# Patient Record
Sex: Male | Born: 1945 | Race: White | Hispanic: No | Marital: Married | Smoking: Former smoker
Health system: Southern US, Community
[De-identification: ages and names within clinical notes are randomized; demographics above are authoritative.]

## PROBLEM LIST (undated history)

## (undated) DIAGNOSIS — M419 Scoliosis, unspecified: Secondary | ICD-10-CM

## (undated) DIAGNOSIS — H5440 Blindness, one eye, unspecified eye: Secondary | ICD-10-CM

## (undated) HISTORY — PX: EYE SURGERY: SHX253

---

## 2005-03-17 ENCOUNTER — Ambulatory Visit: Payer: Self-pay | Admitting: Family Medicine

## 2005-05-22 ENCOUNTER — Ambulatory Visit: Payer: Self-pay | Admitting: Family Medicine

## 2005-06-05 ENCOUNTER — Ambulatory Visit (HOSPITAL_COMMUNITY): Admission: RE | Admit: 2005-06-05 | Discharge: 2005-06-05 | Payer: Self-pay | Admitting: *Deleted

## 2005-12-06 ENCOUNTER — Emergency Department (HOSPITAL_COMMUNITY): Admission: EM | Admit: 2005-12-06 | Discharge: 2005-12-06 | Payer: Self-pay | Admitting: Emergency Medicine

## 2016-06-05 ENCOUNTER — Encounter (HOSPITAL_COMMUNITY): Payer: Self-pay | Admitting: Emergency Medicine

## 2016-06-05 ENCOUNTER — Emergency Department (HOSPITAL_COMMUNITY)
Admission: EM | Admit: 2016-06-05 | Discharge: 2016-06-05 | Disposition: A | Discharge status: Home / Self Care | Payer: Medicare Other | Attending: Emergency Medicine | Admitting: Emergency Medicine

## 2016-06-05 DIAGNOSIS — Z87891 Personal history of nicotine dependence: Secondary | ICD-10-CM | POA: Insufficient documentation

## 2016-06-05 DIAGNOSIS — M5442 Lumbago with sciatica, left side: Secondary | ICD-10-CM | POA: Diagnosis not present

## 2016-06-05 DIAGNOSIS — M545 Low back pain: Secondary | ICD-10-CM | POA: Diagnosis present

## 2016-06-05 DIAGNOSIS — M5432 Sciatica, left side: Secondary | ICD-10-CM

## 2016-06-05 HISTORY — DX: Blindness, one eye, unspecified eye: H54.40

## 2016-06-05 HISTORY — DX: Scoliosis, unspecified: M41.9

## 2016-06-05 MED ORDER — PREDNISONE 20 MG PO TABS: 40.0000 mg | ORAL_TABLET | Freq: Every day | ORAL | 0 refills | Status: DC

## 2016-06-05 NOTE — ED Provider Notes (Signed)
[facility]EM[facility]Y DEPT Provider Note   CSN: [id number] Arrival date & time: 06/05/16  1145     History   Chief Complaint Chief Complaint  Patient presents with  . Hip Pain    HPI Frank Haas is a 71 y.o. male.  Patient presents to the emergency department with chief complaint of left-sided low back pain. He states that he was recently diagnosed with a herniated disc in his low back. He has been referred to a spine specialist, but has been unable to follow-up at this point. He states that the pain radiates down the front side of his left leg. He denies any bowel or bladder incontinence. He walks at baseline using a walker. He has tried taking OTC medications with no relief. There are no other associated symptoms or modifying factors.   The history is provided by the patient. No language interpreter was used.    Past Medical History:  Diagnosis Date  . Blind right eye   . Scoliosis     There are no active problems to display for this patient.   Past Surgical History:  Procedure Laterality Date  . EYE SURGERY     right eye removed       Home Medications    Prior to Admission medications   Medication Sig Start Date End Date Taking? Authorizing Provider  predniSONE (DELTASONE) 20 MG tablet Take 2 tablets (40 mg total) by mouth daily. Take 40 mg by mouth daily for 3 days, then 20mg  by mouth daily for 3 days, then 10mg  daily for 3 days 06/05/16   Roxy Horsemanobert Thamar Holik, PA-C    Family History No family history on file.  Social History Social History  Substance Use Topics  . Smoking status: Former Games developermoker  . Smokeless tobacco: Never Used  . Alcohol use No     Allergies   Demerol [meperidine]   Review of Systems Review of Systems  Constitutional: Negative for chills and fever.  Gastrointestinal:       No bowel incontinence  Genitourinary:       No urinary incontinence  Musculoskeletal: Positive for arthralgias, back pain and myalgias.  Neurological:       No  saddle anesthesia     Physical Exam Updated Vital Signs BP 139/89 (BP Location: Right Arm) Comment: Repeat. Patient has tremors in bilateral arms.  Pulse 107   Temp 98.3 F (36.8 C) (Oral)   Resp 20   Ht 6' (1.829 m)   Wt 90.7 kg   SpO2 97%   BMI 27.12 kg/m   Physical Exam Physical Exam  Constitutional: Pt appears well-developed and well-nourished. No distress.  HENT:  Head: Normocephalic and atraumatic.  Mouth/Throat: Oropharynx is clear and moist. No oropharyngeal exudate.  Eyes: Conjunctivae are normal.  Neck: Normal range of motion. Neck supple.  No meningismus Cardiovascular: Normal rate, regular rhythm and intact distal pulses.   Pulmonary/Chest: Effort normal and breath sounds normal. No respiratory distress. Pt has no wheezes.  Abdominal: Pt exhibits no distension Musculoskeletal:  Left lumbar musculature tender to palpation, no bony CTLS spine tenderness, deformity, step-off, or crepitus Lymphadenopathy: Pt has no cervical adenopathy.  Neurological: Pt is alert and oriented Speech is clear and goal oriented, follows commands Normal 5/5 strength in upper and lower extremities bilaterally including dorsiflexion and plantar flexion, strong and equal grip strength Sensation intact Great toe extension intact Moves extremities without ataxia, coordination intact Ambulates with walker No Clonus Skin: Skin is warm and dry. No rash noted. Pt is  not diaphoretic. No erythema.  Psychiatric: Pt has a normal mood and affect. Behavior is normal.  Nursing note and vitals reviewed.   ED Treatments / Results  Labs (all labs ordered are listed, but only abnormal results are displayed) Labs Reviewed - No data to display  EKG  EKG Interpretation None       Radiology No results found.  Procedures Procedures (including critical care time)  Medications Ordered in ED Medications - No data to display   Initial Impression / Assessment and Plan / ED Course  I have  reviewed the triage vital signs and the nursing notes.  Pertinent labs & imaging results that were available during my care of the patient were reviewed by me and considered in my medical decision making (see chart for details).  Clinical Course     Patient with back pain.  No neurological deficits and normal neuro exam.  Patient is ambulatory.  No loss of bowel or bladder control.  Doubt cauda equina.  Denies fever,  doubt epidural abscess or other lesion. Recommend back exercises, stretching, RICE, and will treat with a short course of prednisone.  Encouraged the patient that there could be a need for additional workup and/or imaging such as MRI, if the symptoms do not resolve. Patient advised that if the back pain does not resolve, or radiates, this could progress to more serious conditions and is encouraged to follow-up with PCP or orthopedics within 2 weeks.     Final Clinical Impressions(s) / ED Diagnoses   Final diagnoses:  Sciatica of left side    New Prescriptions New Prescriptions   PREDNISONE (DELTASONE) 20 MG TABLET    Take 2 tablets (40 mg total) by mouth daily. Take 40 mg by mouth daily for 3 days, then 20mg  by mouth daily for 3 days, then 10mg  daily for 3 days     Roxy Horseman, PA-C 06/05/16 1318    Jacalyn Lefevre, MD 06/05/16 1416

## 2016-06-05 NOTE — ED Triage Notes (Signed)
Pt slipped disk  L5 in November, today left hip and leg pain, burning

## 2016-06-05 NOTE — ED Notes (Signed)
Pt made aware to return if symptoms worsen or if any life threatening symptoms occur.   

## 2021-04-02 ENCOUNTER — Ambulatory Visit (INDEPENDENT_AMBULATORY_CARE_PROVIDER_SITE_OTHER): Payer: Medicare Other | Admitting: Family Medicine

## 2021-04-02 ENCOUNTER — Encounter: Payer: Self-pay | Admitting: Family Medicine

## 2021-04-02 ENCOUNTER — Other Ambulatory Visit: Payer: Self-pay

## 2021-04-02 VITALS — BP 142/89 | HR 97 | Temp 98.1°F | Ht 72.0 in | Wt 192.0 lb

## 2021-04-02 DIAGNOSIS — Z23 Encounter for immunization: Secondary | ICD-10-CM

## 2021-04-02 DIAGNOSIS — G250 Essential tremor: Secondary | ICD-10-CM

## 2021-04-02 DIAGNOSIS — M2550 Pain in unspecified joint: Secondary | ICD-10-CM

## 2021-04-02 DIAGNOSIS — H5440 Blindness, one eye, unspecified eye: Secondary | ICD-10-CM | POA: Insufficient documentation

## 2021-04-02 DIAGNOSIS — R030 Elevated blood-pressure reading, without diagnosis of hypertension: Secondary | ICD-10-CM | POA: Insufficient documentation

## 2021-04-02 DIAGNOSIS — Z5320 Procedure and treatment not carried out because of patient's decision for unspecified reasons: Secondary | ICD-10-CM

## 2021-04-02 DIAGNOSIS — R351 Nocturia: Secondary | ICD-10-CM

## 2021-04-02 DIAGNOSIS — M419 Scoliosis, unspecified: Secondary | ICD-10-CM | POA: Insufficient documentation

## 2021-04-02 DIAGNOSIS — Z6826 Body mass index (BMI) 26.0-26.9, adult: Secondary | ICD-10-CM | POA: Diagnosis not present

## 2021-04-02 MED ORDER — PROPRANOLOL HCL 10 MG PO TABS: 10.0000 mg | ORAL_TABLET | Freq: Two times a day (BID) | ORAL | 5 refills | Status: DC

## 2021-04-02 NOTE — Progress Notes (Signed)
Subjective:  Patient ID: Frank Haas, male    DOB: [birth year], 75 y.o.   MRN: [id number]  Patient Care Team: Baruch Gouty, FNP as PCP - General (Family Medicine)   Chief Complaint:  New Patient (Initial Visit)   HPI: Frank Haas is a 75 y.o. male presenting on 04/02/2021 for New Patient (Initial Visit)   Pt presents today to establish care with new PCP. Pt states he has not been seen by a PCP in over 3 years. EHR database is incomplete and records have been requested. His biggest concern today is his arthritis pain and tremors in both hands. He states he has had pain in his back, knees, and hips for several years. States the pain comes and goes, worse in the morning at times and with activity at times. He denies known injuries or recent falls. He has taken tylenol with some relief of symptoms. He has been seen in the ED in the past with similar complaints. Has not followed up with PCP or ortho pertaining to arthritic pain.  He states the tremors in his hands has been present for several years and seems to be worsening. Gets better with intentional movements. No loss of function.  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      Relevant past medical, surgical, family, and social history reviewed and updated as indicated.  Allergies and medications reviewed and updated. Data reviewed: Chart in Epic.   Past Medical History:  Diagnosis Date   Blind right eye    Scoliosis     Past Surgical History:  Procedure Laterality Date   EYE SURGERY     right eye removed    Social History   Socioeconomic History   Marital status: Married    Spouse name: Not on file   Number of children: Not on file   Years of education: Not on file   Highest education level: Not on file  Occupational History   Not on file  Tobacco Use   Smoking status: Former   Smokeless tobacco: Never  Substance and Sexual Activity   Alcohol use: No   Drug use: No   Sexual  activity: Not on file  Other Topics Concern   Not on file  Social History Narrative   Not on file   Social Determinants of Health   Financial Resource Strain: Not on file  Food Insecurity: Not on file  Transportation Needs: Not on file  Physical Activity: Not on file  Stress: Not on file  Social Connections: Not on file  Intimate Partner Violence: Not on file    Outpatient Encounter Medications as of 04/02/2021  Medication Sig   acetaminophen (TYLENOL) 650 MG CR tablet Take 650 mg by mouth as needed for pain.   propranolol (INDERAL) 10 MG tablet Take 1 tablet (10 mg total) by mouth 2 (two) times daily.   [DISCONTINUED] predniSONE (DELTASONE) 20 MG tablet Take 2 tablets (40 mg total) by mouth daily. Take 40 mg by mouth daily for 3 days, then 43m by mouth daily for 3 days, then 145mdaily for 3 days   No facility-administered encounter medications on file as of 04/02/2021.    Allergies  Allergen Reactions   Demerol [Meperidine] Nausea Only    Review of Systems  Constitutional:  Negative for activity change, appetite change, chills, diaphoresis, fatigue, fever and unexpected weight change.  HENT:  Positive for hearing loss.   Eyes:  Positive for  visual disturbance.  Respiratory:  Negative for cough, chest tightness and shortness of breath.   Cardiovascular:  Negative for chest pain, palpitations and leg swelling.  Gastrointestinal:  Negative for abdominal pain, blood in stool, constipation, diarrhea, nausea and vomiting.  Endocrine: Negative.   Genitourinary:  Negative for decreased urine volume, difficulty urinating, dysuria, flank pain, frequency, genital sores, hematuria, penile discharge, penile pain, penile swelling, scrotal swelling, testicular pain and urgency.       Frequent nocturia  Musculoskeletal:  Positive for arthralgias, back pain and gait problem. Negative for joint swelling, myalgias, neck pain and neck stiffness.  Skin: Negative.   Allergic/Immunologic:  Negative.   Neurological:  Positive for tremors. Negative for dizziness, seizures, syncope, facial asymmetry, speech difficulty, weakness, light-headedness, numbness and headaches.  Hematological: Negative.   Psychiatric/Behavioral:  Negative for confusion, hallucinations, sleep disturbance and suicidal ideas.   All other systems reviewed and are negative.      Objective:  BP (!) 142/89   Pulse 97   Temp 98.1 F (36.7 C)   Ht 6' (1.829 m)   Wt 192 lb (87.1 kg)   SpO2 95%   BMI 26.04 kg/m    Wt Readings from Last 3 Encounters:  04/02/21 192 lb (87.1 kg)  06/05/16 200 lb (90.7 kg)    Physical Exam Vitals and nursing note reviewed.  Constitutional:      General: He is not in acute distress.    Appearance: Normal appearance. He is well-developed, well-groomed and overweight. He is not ill-appearing, toxic-appearing or diaphoretic.  HENT:     Head: Normocephalic and atraumatic.     Jaw: There is normal jaw occlusion.     Right Ear: Tympanic membrane and external ear normal. Decreased hearing noted.     Left Ear: Tympanic membrane and external ear normal. Decreased hearing noted.     Ears:     Comments: Bilateral cerumen noted, not impacted.     Nose: Nose normal.     Mouth/Throat:     Lips: Pink.     Mouth: Mucous membranes are moist.     Pharynx: Oropharynx is clear. Uvula midline.  Eyes:     General: Lids are normal.     Conjunctiva/sclera: Conjunctivae normal.     Comments: Prosthetic right eye. Left pupil reactive to light  Neck:     Thyroid: No thyroid mass, thyromegaly or thyroid tenderness.     Vascular: No carotid bruit or JVD.     Trachea: Trachea and phonation normal.  Cardiovascular:     Rate and Rhythm: Normal rate and regular rhythm.     Chest Wall: PMI is not displaced.     Pulses: Normal pulses.     Heart sounds: Normal heart sounds. No murmur heard.   No friction rub. No gallop.  Pulmonary:     Effort: Pulmonary effort is normal. No respiratory  distress.     Breath sounds: Normal breath sounds. No wheezing.  Abdominal:     General: Bowel sounds are normal. There is no distension or abdominal bruit.     Palpations: Abdomen is soft. There is no hepatomegaly or splenomegaly.     Tenderness: There is no abdominal tenderness. There is no right CVA tenderness or left CVA tenderness.     Hernia: No hernia is present.  Musculoskeletal:     Cervical back: Neck supple. No swelling, edema, deformity, erythema, signs of trauma, lacerations, rigidity, spasms, torticollis, tenderness, bony tenderness or crepitus. No pain with movement. Normal range of  motion.     Thoracic back: No swelling, edema, deformity, signs of trauma, lacerations, spasms, tenderness or bony tenderness. Decreased range of motion. Scoliosis present.     Lumbar back: Tenderness present. No swelling, edema, deformity, signs of trauma, lacerations, spasms or bony tenderness. Decreased range of motion. Negative right straight leg raise test and negative left straight leg raise test. Scoliosis present.     Right lower leg: No edema.     Left lower leg: No edema.  Lymphadenopathy:     Cervical: No cervical adenopathy.  Skin:    General: Skin is warm and dry.     Capillary Refill: Capillary refill takes less than 2 seconds.     Coloration: Skin is not cyanotic, jaundiced or pale.     Findings: No rash.  Neurological:     General: No focal deficit present.     Mental Status: He is alert and oriented to person, place, and time.     GCS: GCS eye subscore is 4. GCS verbal subscore is 5. GCS motor subscore is 6.     Cranial Nerves: No cranial nerve deficit.     Sensory: Sensation is intact.     Motor: Tremor (bilateral upper extremities) present. No weakness.     Coordination: Coordination is intact.     Gait: Gait abnormal.     Deep Tendon Reflexes: Reflexes are normal and symmetric.  Psychiatric:        Attention and Perception: Attention and perception normal.        Mood and  Affect: Mood and affect normal.        Speech: Speech normal.        Behavior: Behavior normal. Behavior is cooperative.        Thought Content: Thought content normal.        Cognition and Memory: Cognition and memory normal.        Judgment: Judgment normal.    No results found for this or any previous visit.     Pertinent labs & imaging results that were available during my care of the patient were reviewed by me and considered in my medical decision making.  Assessment & Plan:  Elton was seen today for new patient (initial visit).  Diagnoses and all orders for this visit:  Essential tremor Will check thyroid function. Will start on low dose propanolol to see if beneficial. If symptoms progress, will refer to neurology. Pt declines referral today.  -     Thyroid Panel With TSH -     propranolol (INDERAL) 10 MG tablet; Take 1 tablet (10 mg total) by mouth 2 (two) times daily.  BMI 26.0-26.9,adult Labs pending. Diet and exercise encouraged.  -     CBC with Differential/Platelet -     CMP14+EGFR -     Lipid panel -     Thyroid Panel With TSH  Elevated blood-pressure reading without diagnosis of hypertension DASH diet and exercise encouraged. BB therapy for tremors will be beneficial. Labs pending. Report any persistent elevated readings.  -     CBC with Differential/Platelet -     CMP14+EGFR -     Lipid panel -     Thyroid Panel With TSH  Nocturia more than twice per night Declines DRE. Will check PSA today. No indications of acute prostatitis or chronic prostatitis, likley BPH with LUTS. Will initiate therapy if symptoms worsen.  -     PSA, total and free  Multiple joint pain Symptomatic care discussed in  detail. Will obtain arthritis panel. Further treatment pending results.  -     Arthritis Panel  Screening for malignant neoplasm of colon declined Offered referral for colonoscopy and order for cologuard, pt declined both.   Need for influenza vaccination Influenza  vaccination given today.    Continue all other maintenance medications.  Follow up plan: Return in about 6 weeks (around 05/14/2021), or if symptoms worsen or fail to improve, for tremor.   Continue healthy lifestyle choices, including diet (rich in fruits, vegetables, and lean proteins, and low in salt and simple carbohydrates) and exercise (at least 30 minutes of moderate physical activity daily).  Educational handout given for arthritis, tremor  The above assessment and management plan was discussed with the patient. The patient verbalized understanding of and has agreed to the management plan. Patient is aware to call the clinic if they develop any new symptoms or if symptoms persist or worsen. Patient is aware when to return to the clinic for a follow-up visit. Patient educated on when it is appropriate to go to the emergency department.   Monia Pouch, FNP-C [facility] [phone]

## 2021-04-03 ENCOUNTER — Other Ambulatory Visit: Payer: Self-pay | Admitting: *Deleted

## 2021-04-03 DIAGNOSIS — R030 Elevated blood-pressure reading, without diagnosis of hypertension: Secondary | ICD-10-CM

## 2021-04-03 LAB — ARTHRITIS PANEL
Basophils Absolute: 1.2 10*3/uL — ABNORMAL HIGH (ref 0.0–0.2)
Basos: 8 %
EOS (ABSOLUTE): 3.4 10*3/uL — ABNORMAL HIGH (ref 0.0–0.4)
Eos: 22 %
Hematocrit: 40.6 % (ref 37.5–51.0)
Hemoglobin: 12.7 g/dL — ABNORMAL LOW (ref 13.0–17.7)
Immature Grans (Abs): 0.2 10*3/uL — ABNORMAL HIGH (ref 0.0–0.1)
Immature Granulocytes: 1 %
Lymphocytes Absolute: 3.5 10*3/uL — ABNORMAL HIGH (ref 0.7–3.1)
Lymphs: 23 %
MCH: 25.8 pg — ABNORMAL LOW (ref 26.6–33.0)
MCHC: 31.3 g/dL — ABNORMAL LOW (ref 31.5–35.7)
MCV: 83 fL (ref 79–97)
Monocytes Absolute: 0.4 10*3/uL (ref 0.1–0.9)
Monocytes: 2 %
NRBC: 2 % — ABNORMAL HIGH (ref 0–0)
Neutrophils Absolute: 6.7 10*3/uL (ref 1.4–7.0)
Neutrophils: 44 %
Platelets: 452 10*3/uL — ABNORMAL HIGH (ref 150–450)
RBC: 4.92 x10E6/uL (ref 4.14–5.80)
RDW: 32.9 % — ABNORMAL HIGH (ref 11.6–15.4)
Rhuematoid fact SerPl-aCnc: <10 IU/mL (ref <14.0)
Sed Rate: 10 mm/hr (ref 0–30)
Uric Acid: 7.3 mg/dL (ref 3.8–8.4)
WBC: 15.3 10*3/uL — ABNORMAL HIGH (ref 3.4–10.8)

## 2021-04-03 LAB — THYROID PANEL WITH TSH
Free Thyroxine Index: 1.8 (ref 1.2–4.9)
T3 Uptake Ratio: 26 % (ref 24–39)
T4, Total: 7 ug/dL (ref 4.5–12.0)
TSH: 1.11 u[IU]/mL (ref 0.450–4.500)

## 2021-04-03 LAB — CMP14+EGFR
ALT: 10 IU/L (ref 0–44)
AST: 17 IU/L (ref 0–40)
Albumin/Globulin Ratio: 2.1 (ref 1.2–2.2)
Albumin: 4.5 g/dL (ref 3.7–4.7)
Alkaline Phosphatase: 60 IU/L (ref 44–121)
BUN/Creatinine Ratio: 8 — ABNORMAL LOW (ref 10–24)
BUN: 9 mg/dL (ref 8–27)
Bilirubin Total: 1.3 mg/dL — ABNORMAL HIGH (ref 0.0–1.2)
CO2: 24 mmol/L (ref 20–29)
Calcium: 9.3 mg/dL (ref 8.6–10.2)
Chloride: 104 mmol/L (ref 96–106)
Creatinine, Ser: 1.07 mg/dL (ref 0.76–1.27)
Globulin, Total: 2.1 g/dL (ref 1.5–4.5)
Glucose: 98 mg/dL (ref 70–99)
Potassium: 5.4 mmol/L — ABNORMAL HIGH (ref 3.5–5.2)
Sodium: 140 mmol/L (ref 134–144)
Total Protein: 6.6 g/dL (ref 6.0–8.5)
eGFR: 73 mL/min/{1.73_m2} (ref >59)

## 2021-04-03 LAB — PSA, TOTAL AND FREE
PSA, Free Pct: 36.7 %
PSA, Free: 0.33 ng/mL
Prostate Specific Ag, Serum: 0.9 ng/mL (ref 0.0–4.0)

## 2021-04-03 LAB — LIPID PANEL
Chol/HDL Ratio: 3.5 ratio (ref 0.0–5.0)
Cholesterol, Total: 137 mg/dL (ref 100–199)
HDL: 39 mg/dL — ABNORMAL LOW (ref >39)
LDL Chol Calc (NIH): 83 mg/dL (ref 0–99)
Triglycerides: 76 mg/dL (ref 0–149)
VLDL Cholesterol Cal: 15 mg/dL (ref 5–40)

## 2021-04-05 ENCOUNTER — Other Ambulatory Visit: Payer: Medicare Other

## 2021-05-07 ENCOUNTER — Telehealth: Payer: Self-pay | Admitting: Family Medicine

## 2021-05-14 ENCOUNTER — Ambulatory Visit (INDEPENDENT_AMBULATORY_CARE_PROVIDER_SITE_OTHER): Payer: Medicare Other

## 2021-05-14 ENCOUNTER — Encounter: Payer: Self-pay | Admitting: Family Medicine

## 2021-05-14 ENCOUNTER — Ambulatory Visit (INDEPENDENT_AMBULATORY_CARE_PROVIDER_SITE_OTHER): Payer: Medicare Other | Admitting: Family Medicine

## 2021-05-14 VITALS — BP 132/74 | HR 71 | Temp 98.0°F | Ht 72.0 in | Wt 191.0 lb

## 2021-05-14 DIAGNOSIS — G250 Essential tremor: Secondary | ICD-10-CM | POA: Diagnosis not present

## 2021-05-14 DIAGNOSIS — E875 Hyperkalemia: Secondary | ICD-10-CM

## 2021-05-14 DIAGNOSIS — M545 Low back pain, unspecified: Secondary | ICD-10-CM

## 2021-05-14 DIAGNOSIS — H6123 Impacted cerumen, bilateral: Secondary | ICD-10-CM

## 2021-05-14 DIAGNOSIS — R7989 Other specified abnormal findings of blood chemistry: Secondary | ICD-10-CM | POA: Diagnosis not present

## 2021-05-14 NOTE — Patient Instructions (Signed)
Debrox

## 2021-05-14 NOTE — Progress Notes (Signed)
Subjective:  Patient ID: JES COSTALES, male    DOB: 1946-03-06, 74 y.o.   MRN: [id number]  Patient Care Team: Baruch Gouty, FNP as PCP - General (Family Medicine)   Chief Complaint:  Tremors (6 week follow up)   HPI: ZEB RAWL is a 75 y.o. male presenting on 05/14/2021 for Tremors (6 week follow up)   Pt presents today for follow up after being started on Propanolol for resting upper extremity tremors. He has been taking the medications as prescribed without associated side effects. States the tremor has improved greatly with the medications, but is still present. He states his hearing continues to be decreased. No otalgia. He complains of continued lower back pain that radiates to his left leg at times. He takes tylenol or motrin with some relief of symptoms. No saddle anesthesia, loss of bowel or bladder function, or weakness. Labs from prior visit revealed elevated platelet count, potassium, and WBC. Pt denies any indications of systemic infection. No abnormal bleeding or bruising, chest pain, palpitations, or myalgias.     Relevant past medical, surgical, family, and social history reviewed and updated as indicated.  Allergies and medications reviewed and updated. Data reviewed: Chart in Epic.   Past Medical History:  Diagnosis Date   Blind right eye    Scoliosis     Past Surgical History:  Procedure Laterality Date   EYE SURGERY     right eye removed    Social History   Socioeconomic History   Marital status: Married    Spouse name: Not on file   Number of children: Not on file   Years of education: Not on file   Highest education level: Not on file  Occupational History   Not on file  Tobacco Use   Smoking status: Former   Smokeless tobacco: Never  Substance and Sexual Activity   Alcohol use: No   Drug use: No   Sexual activity: Not on file  Other Topics Concern   Not on file  Social History Narrative   Not on file   Social Determinants of  Health   Financial Resource Strain: Not on file  Food Insecurity: Not on file  Transportation Needs: Not on file  Physical Activity: Not on file  Stress: Not on file  Social Connections: Not on file  Intimate Partner Violence: Not on file    Outpatient Encounter Medications as of 05/14/2021  Medication Sig   acetaminophen (TYLENOL) 650 MG CR tablet Take 650 mg by mouth as needed for pain.   propranolol (INDERAL) 10 MG tablet Take 1 tablet (10 mg total) by mouth 2 (two) times daily.   No facility-administered encounter medications on file as of 05/14/2021.    Allergies  Allergen Reactions   Demerol [Meperidine] Nausea Only    Review of Systems  Constitutional:  Negative for activity change, appetite change, chills, diaphoresis, fatigue, fever and unexpected weight change.  HENT:  Positive for hearing loss.   Eyes: Negative.   Respiratory:  Negative for cough, chest tightness and shortness of breath.   Cardiovascular:  Negative for chest pain, palpitations and leg swelling.  Gastrointestinal:  Negative for abdominal pain, blood in stool, constipation, diarrhea, nausea and vomiting.  Endocrine: Negative.   Genitourinary:  Negative for dysuria, frequency and urgency.  Musculoskeletal:  Positive for back pain. Negative for arthralgias and myalgias.  Skin: Negative.   Allergic/Immunologic: Negative.   Neurological:  Positive for tremors. Negative for dizziness, seizures, syncope, facial  asymmetry, speech difficulty, weakness, light-headedness, numbness and headaches.  Hematological: Negative.   Psychiatric/Behavioral:  Negative for confusion, hallucinations, sleep disturbance and suicidal ideas.   All other systems reviewed and are negative.      Objective:  BP 132/74    Pulse 71    Temp 98 F (36.7 C)    Ht 6' (1.829 m)    Wt 191 lb (86.6 kg)    SpO2 96%    BMI 25.90 kg/m    Wt Readings from Last 3 Encounters:  05/14/21 191 lb (86.6 kg)  04/02/21 192 lb (87.1 kg)   06/05/16 200 lb (90.7 kg)    Physical Exam Vitals and nursing note reviewed.  Constitutional:      General: He is not in acute distress.    Appearance: Normal appearance. He is not ill-appearing, toxic-appearing or diaphoretic.  HENT:     Head: Normocephalic and atraumatic.     Right Ear: There is impacted cerumen.     Left Ear: There is impacted cerumen.     Nose: Nose normal.     Mouth/Throat:     Mouth: Mucous membranes are moist.     Pharynx: Oropharynx is clear.  Eyes:     Comments: Prosthetic right eye, left pupil PERRL  Cardiovascular:     Rate and Rhythm: Normal rate and regular rhythm.     Pulses: Normal pulses.     Heart sounds: Normal heart sounds.  Pulmonary:     Effort: Pulmonary effort is normal.     Breath sounds: Normal breath sounds.  Abdominal:     General: Bowel sounds are normal.     Palpations: Abdomen is soft.  Musculoskeletal:     Thoracic back: No swelling, edema, deformity, signs of trauma, lacerations, spasms, tenderness or bony tenderness. Decreased range of motion. Scoliosis present.     Lumbar back: Tenderness present. No swelling, edema, deformity, signs of trauma, lacerations, spasms or bony tenderness. Decreased range of motion. Negative right straight leg raise test and negative left straight leg raise test. Scoliosis present.     Right hip: Normal.     Left hip: Normal.     Right lower leg: No edema.     Left lower leg: No edema.  Skin:    General: Skin is warm.     Capillary Refill: Capillary refill takes less than 2 seconds.  Neurological:     General: No focal deficit present.     Mental Status: He is alert and oriented to person, place, and time.     Motor: Tremor (resting, bilateral upper extremities) present.     Gait: Gait abnormal (slow, shuffling).    Results for orders placed or performed in visit on 04/02/21  CMP14+EGFR  Result Value Ref Range   Glucose 98 70 - 99 mg/dL   BUN 9 8 - 27 mg/dL   Creatinine, Ser 1.07 0.76 -  1.27 mg/dL   eGFR 73 >59 mL/min/1.73   BUN/Creatinine Ratio 8 (L) 10 - 24   Sodium 140 134 - 144 mmol/L   Potassium 5.4 (H) 3.5 - 5.2 mmol/L   Chloride 104 96 - 106 mmol/L   CO2 24 20 - 29 mmol/L   Calcium 9.3 8.6 - 10.2 mg/dL   Total Protein 6.6 6.0 - 8.5 g/dL   Albumin 4.5 3.7 - 4.7 g/dL   Globulin, Total 2.1 1.5 - 4.5 g/dL   Albumin/Globulin Ratio 2.1 1.2 - 2.2   Bilirubin Total 1.3 (H) 0.0 - 1.2 mg/dL  Alkaline Phosphatase 60 44 - 121 IU/L   AST 17 0 - 40 IU/L   ALT 10 0 - 44 IU/L  Lipid panel  Result Value Ref Range   Cholesterol, Total 137 100 - 199 mg/dL   Triglycerides 76 0 - 149 mg/dL   HDL 39 (L) >39 mg/dL   VLDL Cholesterol Cal 15 5 - 40 mg/dL   LDL Chol Calc (NIH) 83 0 - 99 mg/dL   Chol/HDL Ratio 3.5 0.0 - 5.0 ratio  Thyroid Panel With TSH  Result Value Ref Range   TSH 1.110 0.450 - 4.500 uIU/mL   T4, Total 7.0 4.5 - 12.0 ug/dL   T3 Uptake Ratio 26 24 - 39 %   Free Thyroxine Index 1.8 1.2 - 4.9  PSA, total and free  Result Value Ref Range   Prostate Specific Ag, Serum 0.9 0.0 - 4.0 ng/mL   PSA, Free 0.33 N/A ng/mL   PSA, Free Pct 36.7 %  Arthritis Panel  Result Value Ref Range   Uric Acid 7.3 3.8 - 8.4 mg/dL   Rhuematoid fact SerPl-aCnc <10.0 <14.0 IU/mL   WBC 15.3 (H) 3.4 - 10.8 x10E3/uL   RBC 4.92 4.14 - 5.80 x10E6/uL   Hemoglobin 12.7 (L) 13.0 - 17.7 g/dL   Hematocrit 40.6 37.5 - 51.0 %   MCV 83 79 - 97 fL   MCH 25.8 (L) 26.6 - 33.0 pg   MCHC 31.3 (L) 31.5 - 35.7 g/dL   RDW 32.9 (H) 11.6 - 15.4 %   Platelets 452 (H) 150 - 450 x10E3/uL   Neutrophils 44 Not Estab. %   Lymphs 23 Not Estab. %   Monocytes 2 Not Estab. %   Eos 22 Not Estab. %   Basos 8 Not Estab. %   Neutrophils Absolute 6.7 1.4 - 7.0 x10E3/uL   Lymphocytes Absolute 3.5 (H) 0.7 - 3.1 x10E3/uL   Monocytes Absolute 0.4 0.1 - 0.9 x10E3/uL   EOS (ABSOLUTE) 3.4 (H) 0.0 - 0.4 x10E3/uL   Basophils Absolute 1.2 (H) 0.0 - 0.2 x10E3/uL   Immature Granulocytes 1 Not Estab. %   Immature Grans  (Abs) 0.2 (H) 0.0 - 0.1 x10E3/uL   NRBC 2 (H) 0 - 0 %   Hematology Comments: Note:    Sed Rate 10 0 - 30 mm/hr     Ear Cerumen Removal  Date/Time: 05/14/2021 11:02 AM Performed by: Baruch Gouty, FNP Authorized by: Baruch Gouty, FNP   Anesthesia: Local Anesthetic: none Location details: right ear Patient tolerance: patient tolerated the procedure well with no immediate complications Comments: Unable to completely clear cerumen from canal, Debrox for 2 weeks and then return for removal.  Procedure type: irrigation  Sedation: Patient sedated: no    Ear Cerumen Removal  Date/Time: 05/14/2021 11:03 AM Performed by: Baruch Gouty, FNP Authorized by: Baruch Gouty, FNP   Anesthesia: Local Anesthetic: none Location details: left ear Patient tolerance: patient tolerated the procedure well with no immediate complications Procedure type: irrigation  Sedation: Patient sedated: no   X-Ray: lumbar spine: scoliosis, disc space narrowing with degenerative changes, no acute findings. Preliminary x-ray reading by Monia Pouch, FNP-C, [facility].    Pertinent labs & imaging results that were available during my care of the patient were reviewed by me and considered in my medical decision making.  Assessment & Plan:  Trysten was seen today for tremors.  Diagnoses and all orders for this visit:  Essential tremor Improved with propranolol therapy. Has resting tremor and  shuffling gait concerning for Parkinson Disease, will place referral to neurology.  -     Ambulatory referral to Neurology  Lumbar back pain Imaging as noted. Report any new, worsening, or persistent symptoms. Continue Tylenol as needed for pain management.  -     DG Lumbar Spine 2-3 Views; Future  Bilateral impacted cerumen Cerumen removal in office. Unable to fully clear right ear. Debrox for 2 weeks and then return for removal.  -     Ear Cerumen Removal -     Ear Cerumen Removal  Elevated platelet  count Labs pending, if still elevated, will refer to hematology.  -     CBC with Differential/Platelet  Hyperkalemia Repeat labs today.  -     BMP8+EGFR    Continue all other maintenance medications.  Follow up plan: Return in about 2 weeks (around 05/28/2021), or if symptoms worsen or fail to improve, for cerumen removal.   Continue healthy lifestyle choices, including diet (rich in fruits, vegetables, and lean proteins, and low in salt and simple carbohydrates) and exercise (at least 30 minutes of moderate physical activity daily).  Educational handout given for ear irrigation  The above assessment and management plan was discussed with the patient. The patient verbalized understanding of and has agreed to the management plan. Patient is aware to call the clinic if they develop any new symptoms or if symptoms persist or worsen. Patient is aware when to return to the clinic for a follow-up visit. Patient educated on when it is appropriate to go to the emergency department.   Monia Pouch, FNP-C [facility] [phone]

## 2021-05-15 MED ORDER — SODIUM POLYSTYRENE SULFONATE 15 GM/60ML PO SUSP: 15.0000 g | Freq: Once | ORAL | 0 refills | Status: AC

## 2021-05-15 NOTE — Addendum Note (Signed)
Addended by: Sonny Masters on: 05/15/2021 07:39 AM   Modules accepted: Orders

## 2021-05-16 ENCOUNTER — Other Ambulatory Visit: Payer: Self-pay | Admitting: Family Medicine

## 2021-05-16 ENCOUNTER — Other Ambulatory Visit: Payer: Self-pay | Admitting: *Deleted

## 2021-05-16 ENCOUNTER — Other Ambulatory Visit: Payer: Medicare Other

## 2021-05-16 DIAGNOSIS — M545 Low back pain, unspecified: Secondary | ICD-10-CM

## 2021-05-16 DIAGNOSIS — E875 Hyperkalemia: Secondary | ICD-10-CM

## 2021-05-16 LAB — BMP8+EGFR
BUN/Creatinine Ratio: 7 — ABNORMAL LOW (ref 10–24)
BUN/Creatinine Ratio: 6 — ABNORMAL LOW (ref 10–24)
BUN: 9 mg/dL (ref 8–27)
BUN: 8 mg/dL (ref 8–27)
CO2: 22 mmol/L (ref 20–29)
CO2: 23 mmol/L (ref 20–29)
Calcium: 9.2 mg/dL (ref 8.6–10.2)
Calcium: 8.8 mg/dL (ref 8.6–10.2)
Chloride: 102 mmol/L (ref 96–106)
Chloride: 103 mmol/L (ref 96–106)
Creatinine, Ser: 1.21 mg/dL (ref 0.76–1.27)
Creatinine, Ser: 1.24 mg/dL (ref 0.76–1.27)
Glucose: 103 mg/dL — ABNORMAL HIGH (ref 70–99)
Glucose: 93 mg/dL (ref 70–99)
Potassium: 6.2 mmol/L (ref 3.5–5.2)
Potassium: 5.7 mmol/L — ABNORMAL HIGH (ref 3.5–5.2)
Sodium: 139 mmol/L (ref 134–144)
Sodium: 141 mmol/L (ref 134–144)
eGFR: 63 mL/min/{1.73_m2} (ref >59)
eGFR: 61 mL/min/{1.73_m2} (ref >59)

## 2021-05-16 LAB — CBC WITH DIFFERENTIAL/PLATELET
Basophils Absolute: 2.7 10*3/uL — ABNORMAL HIGH (ref 0.0–0.2)
Basos: 15 %
EOS (ABSOLUTE): 4.1 10*3/uL — ABNORMAL HIGH (ref 0.0–0.4)
Eos: 23 %
Hematocrit: 39.9 % (ref 37.5–51.0)
Hemoglobin: 12.1 g/dL — ABNORMAL LOW (ref 13.0–17.7)
Lymphocytes Absolute: 3.4 10*3/uL — ABNORMAL HIGH (ref 0.7–3.1)
Lymphs: 19 %
MCH: 25.2 pg — ABNORMAL LOW (ref 26.6–33.0)
MCHC: 30.3 g/dL — ABNORMAL LOW (ref 31.5–35.7)
MCV: 83 fL (ref 79–97)
Monocytes Absolute: 0.2 10*3/uL (ref 0.1–0.9)
Monocytes: 1 %
NRBC: 5 % — ABNORMAL HIGH (ref 0–0)
Neutrophils Absolute: 7.6 10*3/uL — ABNORMAL HIGH (ref 1.4–7.0)
Neutrophils: 42 %
Platelets: 522 10*3/uL — ABNORMAL HIGH (ref 150–450)
RBC: 4.81 x10E6/uL (ref 4.14–5.80)
RDW: 33.6 % — ABNORMAL HIGH (ref 11.6–15.4)
WBC: 18 10*3/uL — ABNORMAL HIGH (ref 3.4–10.8)

## 2021-05-17 ENCOUNTER — Other Ambulatory Visit: Payer: Self-pay | Admitting: *Deleted

## 2021-05-17 DIAGNOSIS — E875 Hyperkalemia: Secondary | ICD-10-CM

## 2021-05-17 MED ORDER — SODIUM POLYSTYRENE SULFONATE 15 GM/60ML PO SUSP: 15.0000 g | Freq: Once | ORAL | 0 refills | Status: AC

## 2021-05-20 ENCOUNTER — Other Ambulatory Visit: Payer: Medicare Other

## 2021-05-20 DIAGNOSIS — E875 Hyperkalemia: Secondary | ICD-10-CM

## 2021-05-21 ENCOUNTER — Encounter: Payer: Medicare Other | Admitting: *Deleted

## 2021-05-21 LAB — BASIC METABOLIC PANEL
BUN/Creatinine Ratio: 9 — ABNORMAL LOW (ref 10–24)
BUN: 11 mg/dL (ref 8–27)
CO2: 23 mmol/L (ref 20–29)
Calcium: 9 mg/dL (ref 8.6–10.2)
Chloride: 100 mmol/L (ref 96–106)
Creatinine, Ser: 1.19 mg/dL (ref 0.76–1.27)
Glucose: 96 mg/dL (ref 70–99)
Potassium: 5.1 mmol/L (ref 3.5–5.2)
Sodium: 138 mmol/L (ref 134–144)
eGFR: 64 mL/min/{1.73_m2} (ref >59)

## 2021-05-21 NOTE — Progress Notes (Signed)
This encounter was created in error - please disregard.

## 2021-05-22 ENCOUNTER — Ambulatory Visit (INDEPENDENT_AMBULATORY_CARE_PROVIDER_SITE_OTHER): Payer: Medicare Other | Admitting: Family Medicine

## 2021-05-22 ENCOUNTER — Encounter: Payer: Self-pay | Admitting: Family Medicine

## 2021-05-22 VITALS — BP 145/79 | HR 66 | Temp 97.3°F | Ht 72.0 in | Wt 192.6 lb

## 2021-05-22 DIAGNOSIS — H6121 Impacted cerumen, right ear: Secondary | ICD-10-CM | POA: Diagnosis not present

## 2021-05-22 DIAGNOSIS — H60311 Diffuse otitis externa, right ear: Secondary | ICD-10-CM

## 2021-05-22 MED ORDER — NEOMYCIN-POLYMYXIN-HC 3.5-10000-1 OT SOLN: 3.0000 [drp] | Freq: Four times a day (QID) | OTIC | 0 refills | Status: AC

## 2021-05-22 NOTE — Progress Notes (Signed)
Subjective:  Patient ID: Frank Haas, male    DOB: 10-03-1945, 75 y.o.   MRN: [id number]  Patient Care Team: Baruch Gouty, FNP as PCP - General (Family Medicine)   Chief Complaint:  Cerumen Impaction   HPI: Frank Haas is a 75 y.o. male presenting on 05/22/2021 for Cerumen Impaction   Patient presents today for cerumen removal from ear.  He was seen on 05/14/2021 and had noted bilateral cerumen impactions.  Left ear was able to be cleared in office but right was not.  He was started on Debrox at this time.  He returns today for cerumen removal.  States that hearing has improved some and right ear has wax has softened with Debrox.  No reported fever, chills, pain, otorrhea, dizziness, or tinnitus.    Relevant past medical, surgical, family, and social history reviewed and updated as indicated.  Allergies and medications reviewed and updated. Data reviewed: Chart in Epic.   Past Medical History:  Diagnosis Date   Blind right eye    Scoliosis     Past Surgical History:  Procedure Laterality Date   EYE SURGERY     right eye removed    Social History   Socioeconomic History   Marital status: Married    Spouse name: Not on file   Number of children: Not on file   Years of education: Not on file   Highest education level: Not on file  Occupational History   Not on file  Tobacco Use   Smoking status: Former   Smokeless tobacco: Never  Substance and Sexual Activity   Alcohol use: No   Drug use: No   Sexual activity: Not on file  Other Topics Concern   Not on file  Social History Narrative   Not on file   Social Determinants of Health   Financial Resource Strain: Not on file  Food Insecurity: Not on file  Transportation Needs: Not on file  Physical Activity: Not on file  Stress: Not on file  Social Connections: Not on file  Intimate Partner Violence: Not on file    Outpatient Encounter Medications as of 05/22/2021  Medication Sig   acetaminophen  (TYLENOL) 650 MG CR tablet Take 650 mg by mouth as needed for pain.   neomycin-polymyxin-hydrocortisone (CORTISPORIN) OTIC solution Place 3 drops into the right ear 4 (four) times daily for 7 days.   propranolol (INDERAL) 10 MG tablet Take 1 tablet (10 mg total) by mouth 2 (two) times daily.   No facility-administered encounter medications on file as of 05/22/2021.    Allergies  Allergen Reactions   Demerol [Meperidine] Nausea Only    Review of Systems as per HPI.      Objective:  BP (!) 145/79    Pulse 66    Temp (!) 97.3 F (36.3 C)    Ht 6' (1.829 m)    Wt 192 lb 9.6 oz (87.4 kg)    SpO2 97%    BMI 26.12 kg/m    Wt Readings from Last 3 Encounters:  05/22/21 192 lb 9.6 oz (87.4 kg)  05/14/21 191 lb (86.6 kg)  04/02/21 192 lb (87.1 kg)    Physical Exam Vitals and nursing note reviewed.  Constitutional:      Appearance: Normal appearance. He is overweight.  HENT:     Head: Normocephalic and atraumatic.     Right Ear: Decreased hearing noted. There is impacted cerumen.     Left Ear: Hearing, tympanic  membrane, ear canal and external ear normal.     Nose: Nose normal.     Mouth/Throat:     Mouth: Mucous membranes are moist.     Pharynx: Oropharynx is clear.  Eyes:     Comments: Prosthetic right eye  Cardiovascular:     Rate and Rhythm: Normal rate and regular rhythm.     Heart sounds: Normal heart sounds. No murmur heard.   No friction rub. No gallop.  Pulmonary:     Effort: Pulmonary effort is normal. No respiratory distress.     Breath sounds: Normal breath sounds.  Neurological:     General: No focal deficit present.     Mental Status: He is alert and oriented to person, place, and time.     Motor: Tremor present.  Psychiatric:        Mood and Affect: Mood normal.        Behavior: Behavior normal.        Thought Content: Thought content normal.        Judgment: Judgment normal.    Results for orders placed or performed in visit on 33/35/45  Basic Metabolic  Panel  Result Value Ref Range   Glucose 96 70 - 99 mg/dL   BUN 11 8 - 27 mg/dL   Creatinine, Ser 1.19 0.76 - 1.27 mg/dL   eGFR 64 >59 mL/min/1.73   BUN/Creatinine Ratio 9 (L) 10 - 24   Sodium 138 134 - 144 mmol/L   Potassium 5.1 3.5 - 5.2 mmol/L   Chloride 100 96 - 106 mmol/L   CO2 23 20 - 29 mmol/L   Calcium 9.0 8.6 - 10.2 mg/dL     Ear Cerumen Removal  Date/Time: 05/22/2021 10:05 AM Performed by: Baruch Gouty, FNP Authorized by: Baruch Gouty, FNP   Anesthesia: Local Anesthetic: none Ceruminolytics applied: Ceruminolytics applied prior to the procedure. (at home for 2 weeks) Location details: right ear Patient tolerance: patient tolerated the procedure well with no immediate complications Procedure type: curette (and irrigation. Small amount of cerumen unable to be removed from lower ear canal. Slight redness to external canal post cerumen removal.)     Pertinent labs & imaging results that were available during my care of the patient were reviewed by me and considered in my medical decision making.  Assessment & Plan:  Rachard was seen today for cerumen impaction.  Diagnoses and all orders for this visit:  Right ear impacted cerumen Small amount of residual cerumen in ear canal, clean for the most part. Has follow up for chronic medical conditions next week, will reevaluate and if still present, will refer to ENT. Pt and family aware.   Acute diffuse otitis externa of right ear Irritation post cerumen removal. Will treat with cortisporin drops as prescribed.  -     neomycin-polymyxin-hydrocortisone (CORTISPORIN) OTIC solution; Place 3 drops into the right ear 4 (four) times daily for 7 days.     Continue all other maintenance medications.  Follow up plan: Return if symptoms worsen or fail to improve.   Continue healthy lifestyle choices, including diet (rich in fruits, vegetables, and lean proteins, and low in salt and simple carbohydrates) and exercise (at  least 30 minutes of moderate physical activity daily).  Educational handout given for ear irrigation  The above assessment and management plan was discussed with the patient. The patient verbalized understanding of and has agreed to the management plan. Patient is aware to call the clinic if they develop any new  symptoms or if symptoms persist or worsen. Patient is aware when to return to the clinic for a follow-up visit. Patient educated on when it is appropriate to go to the emergency department.   Monia Pouch, FNP-C [facility] [phone]

## 2021-05-28 ENCOUNTER — Ambulatory Visit (INDEPENDENT_AMBULATORY_CARE_PROVIDER_SITE_OTHER): Payer: Medicare Other | Admitting: Family Medicine

## 2021-05-28 ENCOUNTER — Encounter: Payer: Self-pay | Admitting: Family Medicine

## 2021-05-28 VITALS — BP 140/76 | HR 72 | Temp 99.1°F | Ht 72.0 in | Wt 191.0 lb

## 2021-05-28 DIAGNOSIS — H6121 Impacted cerumen, right ear: Secondary | ICD-10-CM | POA: Diagnosis not present

## 2021-05-28 NOTE — Progress Notes (Signed)
Subjective:  Patient ID: Frank Haas, male    DOB: 02/11/1946, 75 y.o.   MRN: [id number]  Patient Care Team: Baruch Gouty, FNP as PCP - General (Family Medicine)   Chief Complaint:  Cerumen Impaction   HPI: Frank Haas is a 75 y.o. male presenting on 05/28/2021 for Cerumen Impaction   Pt returns today for reevaluation of right ear after cerumen impaction removal. Attempted removal of cerumen 05/14/2021 of both ears, unsuccessful. Debrox was used and pt returned on 05/22/2021 for cerumen removal. Left ear was cleared and right ear was partially cleared. He returns today for reevaluation. States hearing has improved some since cerumen removal. Denies pain, fever, chills, dizziness, or tinnitus.     Relevant past medical, surgical, family, and social history reviewed and updated as indicated.  Allergies and medications reviewed and updated. Data reviewed: Chart in Epic.   Past Medical History:  Diagnosis Date   Blind right eye    Scoliosis     Past Surgical History:  Procedure Laterality Date   EYE SURGERY     right eye removed    Social History   Socioeconomic History   Marital status: Married    Spouse name: Not on file   Number of children: Not on file   Years of education: Not on file   Highest education level: Not on file  Occupational History   Not on file  Tobacco Use   Smoking status: Former   Smokeless tobacco: Never  Substance and Sexual Activity   Alcohol use: No   Drug use: No   Sexual activity: Not on file  Other Topics Concern   Not on file  Social History Narrative   Not on file   Social Determinants of Health   Financial Resource Strain: Not on file  Food Insecurity: Not on file  Transportation Needs: Not on file  Physical Activity: Not on file  Stress: Not on file  Social Connections: Not on file  Intimate Partner Violence: Not on file    Outpatient Encounter Medications as of 05/28/2021  Medication Sig   acetaminophen  (TYLENOL) 650 MG CR tablet Take 650 mg by mouth as needed for pain.   neomycin-polymyxin-hydrocortisone (CORTISPORIN) OTIC solution Place 3 drops into the right ear 4 (four) times daily for 7 days.   propranolol (INDERAL) 10 MG tablet Take 1 tablet (10 mg total) by mouth 2 (two) times daily.   No facility-administered encounter medications on file as of 05/28/2021.    Allergies  Allergen Reactions   Demerol [Meperidine] Nausea Only    Review of Systems  Constitutional:  Negative for activity change, appetite change, chills, diaphoresis, fatigue, fever and unexpected weight change.  HENT:  Positive for hearing loss (improving). Negative for ear discharge, ear pain and tinnitus.   Respiratory:  Negative for cough and shortness of breath.   Cardiovascular:  Negative for chest pain, palpitations and leg swelling.  Genitourinary:  Negative for decreased urine volume.  Neurological:  Positive for tremors. Negative for dizziness, seizures, syncope, facial asymmetry, speech difficulty, weakness, light-headedness, numbness and headaches.  Psychiatric/Behavioral:  Negative for confusion.   All other systems reviewed and are negative.      Objective:  BP 140/76    Pulse 72    Temp 99.1 F (37.3 C)    Ht 6' (1.829 m)    Wt 191 lb (86.6 kg)    SpO2 97%    BMI 25.90 kg/m    Wt Readings  from Last 3 Encounters:  05/28/21 191 lb (86.6 kg)  05/22/21 192 lb 9.6 oz (87.4 kg)  05/14/21 191 lb (86.6 kg)    Physical Exam Vitals reviewed.  Constitutional:      General: He is not in acute distress.    Appearance: Normal appearance. He is normal weight. He is not toxic-appearing or diaphoretic.  HENT:     Head: Normocephalic and atraumatic.     Right Ear: There is impacted cerumen.     Left Ear: Tympanic membrane, ear canal and external ear normal.  Eyes:     Comments: Right eye prosthesis   Cardiovascular:     Rate and Rhythm: Normal rate and regular rhythm.     Heart sounds: Normal heart  sounds.  Pulmonary:     Effort: Pulmonary effort is normal.     Breath sounds: Normal breath sounds.  Skin:    General: Skin is warm and dry.     Capillary Refill: Capillary refill takes less than 2 seconds.  Neurological:     General: No focal deficit present.     Mental Status: He is alert and oriented to person, place, and time.     Motor: Tremor present.  Psychiatric:        Mood and Affect: Mood normal.        Behavior: Behavior normal.        Thought Content: Thought content normal.        Judgment: Judgment normal.    Results for orders placed or performed in visit on 85/27/78  Basic Metabolic Panel  Result Value Ref Range   Glucose 96 70 - 99 mg/dL   BUN 11 8 - 27 mg/dL   Creatinine, Ser 1.19 0.76 - 1.27 mg/dL   eGFR 64 >59 mL/min/1.73   BUN/Creatinine Ratio 9 (L) 10 - 24   Sodium 138 134 - 144 mmol/L   Potassium 5.1 3.5 - 5.2 mmol/L   Chloride 100 96 - 106 mmol/L   CO2 23 20 - 29 mmol/L   Calcium 9.0 8.6 - 10.2 mg/dL       Pertinent labs & imaging results that were available during my care of the patient were reviewed by me and considered in my medical decision making.  Assessment & Plan:  Keshun was seen today for cerumen impaction.  Diagnoses and all orders for this visit:  Impacted cerumen of right ear Ongoing impaction despite debrox and irrigation for removal. Will refer to ENT for further treatment.  -     Ambulatory referral to ENT     Continue all other maintenance medications.  Follow up plan: Return if symptoms worsen or fail to improve.   Continue healthy lifestyle choices, including diet (rich in fruits, vegetables, and lean proteins, and low in salt and simple carbohydrates) and exercise (at least 30 minutes of moderate physical activity daily).   The above assessment and management plan was discussed with the patient. The patient verbalized understanding of and has agreed to the management plan. Patient is aware to call the clinic if they  develop any new symptoms or if symptoms persist or worsen. Patient is aware when to return to the clinic for a follow-up visit. Patient educated on when it is appropriate to go to the emergency department.   Monia Pouch, FNP-C [facility] [phone]

## 2021-05-30 ENCOUNTER — Ambulatory Visit (INDEPENDENT_AMBULATORY_CARE_PROVIDER_SITE_OTHER): Payer: Medicare Other | Admitting: Orthopaedic Surgery

## 2021-05-30 ENCOUNTER — Other Ambulatory Visit: Payer: Self-pay

## 2021-05-30 ENCOUNTER — Encounter: Payer: Self-pay | Admitting: Orthopaedic Surgery

## 2021-05-30 DIAGNOSIS — M545 Low back pain, unspecified: Secondary | ICD-10-CM

## 2021-05-30 DIAGNOSIS — G8929 Other chronic pain: Secondary | ICD-10-CM | POA: Diagnosis not present

## 2021-05-30 NOTE — Progress Notes (Signed)
Office Visit Note   Patient: Frank Haas           Date of Birth: [birth year]           MRN: [id number] Visit Date: 05/30/2021              Requested by: Sonny Masters, FNP [address] [place],  [place] [address] PCP: Sonny Masters, FNP   Assessment & Plan: Visit Diagnoses:  1. Chronic bilateral low back pain, unspecified whether sciatica present     Plan: Patient with history of falls L to compression fracture old.  Degenerative anterolisthesis but not classic neurogenic claudication symptoms.  States he is able to stand exam which took some or stand to do the dishes most the time without ever stopping.  We discussed using a cane.  In addition he could borrow his wife's walker that she uses occasionally her back pain gets worse.  If he like a prescription walker he will call let us know.  He can get a cane at lanes across the street or at [place].  We discussed appropriate cane usage.  Follow-up if he gets progressive symptoms.  Follow-Up Instructions: No follow-ups on file.   Orders:  No orders of the defined types were placed in this encounter.  No orders of the defined types were placed in this encounter.     Procedures: No procedures performed   Clinical Data: No additional findings.   Subjective: Chief Complaint  Patient presents with   Lower Back - Pain    HPI 75 year old male first-time visit with complaints of low back pain.  Patient has some cervical kyphosis some discomfort in his neck over the trapezius muscles.  He has had pain in his lower back for years states he was told he had "slipped disc".  Plain radiographs that showed some L4-5 anterolisthesis degenerative in nature with facet arthropathy and narrowing at L4-5 and L5-S1 disc space.  Patient at times has to sit when he has increased symptoms.  He denies specific neurogenic claudication symptoms.  He has been started on some Inderal due to some tremor that are present in his hand and he thinks that  propranolol has helped.  His wife also takes it who is with him today.  Patient has had some gait problems balance problems slow ambulator and positive for falls in the past.  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   Objective: Vital Signs: Ht 6' (1.829 m)    Wt 191 lb (86.6 kg)    BMI 25.90 kg/m   Physical Exam Constitutional:      Appearance: He is well-developed.  HENT:     Head: Normocephalic and atraumatic.     Right Ear: External ear normal.     Left Ear: External ear normal.  Eyes:     Extraocular Movements: Extraocular movements intact.     Comments: Blind right eye  Neck:     Thyroid: No thyromegaly.     Trachea: No tracheal deviation.  Cardiovascular:     Rate and Rhythm: Normal rate.  Pulmonary:     Effort: Pulmonary effort is normal.     Breath sounds: No wheezing.  Abdominal:     General: Bowel sounds are normal.     Palpations: Abdomen is soft.  Musculoskeletal:     Cervical back: Neck supple.  Skin:    General: Skin is warm and dry.     Capillary Refill:  Capillary refill takes less than 2 seconds.  Neurological:     Mental Status: He is alert and oriented to person, place, and time.  Psychiatric:        Behavior: Behavior normal.        Thought Content: Thought content normal.        Judgment: Judgment normal.    Ortho Exam patient has tremor worse in the right hand and left.  At times during the visit he had pill-rolling movement of his fingertips.  Slow stride gait negative logroll hips negative straight leg raising 90 degrees.  No lower extremity clonus.  Patient has increased cervical kyphosis.  Specialty Comments:  No specialty comments available.  Imaging: Narrative & Impression  CLINICAL DATA:  Pain.  Weakness in left leg.   EXAM: LUMBAR SPINE - 2-3 VIEW   COMPARISON:  Report from lumbar radiograph 05/18/2016, images not available.   FINDINGS: Frontal and lateral views obtained. 5  lumbar type vertebra. 5 mm anterolisthesis of L4 on L5. Degenerative disc disease with disc space narrowing and spurring at L4-L5 and L5-S1. Facet hypertrophy from L3-L4 through L5-S1, most prominently at L4-L5. Slight anterior wedging of L2 superior endplate, appears chronic. No evidence of focal bone lesion or bone destruction. Probable left renal calculus. Mild degenerative change of both sacroiliac joints which remain congruent.   IMPRESSION: 1. 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 2. Grade 1 anterolisthesis of L4 on L5, likely facet mediated. 3. Slight anterior wedging of L2 superior endplate, chronic, likely mild remote compression fracture. 4. Probable left renal calculus.     Electronically Signed   By: Narda Rutherford M.D.   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  Past Surgical History:  Procedure Laterality Date   EYE SURGERY     right eye removed   Social History   Occupational History   Not on file  Tobacco Use   Smoking status: Former   Smokeless tobacco: Never  Substance and Sexual Activity   Alcohol use: No   Drug use: No   Sexual activity: Not on file

## 2021-09-30 ENCOUNTER — Other Ambulatory Visit: Payer: Self-pay | Admitting: Family Medicine

## 2021-09-30 DIAGNOSIS — G250 Essential tremor: Secondary | ICD-10-CM

## 2021-10-01 NOTE — Telephone Encounter (Signed)
Spoke to wife Fannie Knee and she is not sure if he has seen Neurology she will check with. Made appt for 10/08/2021 with Rakes ?

## 2021-10-08 ENCOUNTER — Encounter: Payer: Self-pay | Admitting: Family Medicine

## 2021-10-08 ENCOUNTER — Ambulatory Visit (INDEPENDENT_AMBULATORY_CARE_PROVIDER_SITE_OTHER): Payer: Medicare Other | Admitting: Family Medicine

## 2021-10-08 VITALS — BP 142/90 | HR 85 | Temp 98.4°F | Ht 72.0 in | Wt 171.2 lb

## 2021-10-08 DIAGNOSIS — G250 Essential tremor: Secondary | ICD-10-CM

## 2021-10-08 DIAGNOSIS — I499 Cardiac arrhythmia, unspecified: Secondary | ICD-10-CM | POA: Diagnosis not present

## 2021-10-08 DIAGNOSIS — R634 Abnormal weight loss: Secondary | ICD-10-CM

## 2021-10-08 DIAGNOSIS — Z23 Encounter for immunization: Secondary | ICD-10-CM

## 2021-10-08 DIAGNOSIS — D72828 Other elevated white blood cell count: Secondary | ICD-10-CM

## 2021-10-08 DIAGNOSIS — E875 Hyperkalemia: Secondary | ICD-10-CM | POA: Diagnosis not present

## 2021-10-08 DIAGNOSIS — R7989 Other specified abnormal findings of blood chemistry: Secondary | ICD-10-CM

## 2021-10-08 DIAGNOSIS — M2550 Pain in unspecified joint: Secondary | ICD-10-CM

## 2021-10-08 DIAGNOSIS — M545 Low back pain, unspecified: Secondary | ICD-10-CM

## 2021-10-08 MED ORDER — PROPRANOLOL HCL 10 MG PO TABS: 10.0000 mg | ORAL_TABLET | Freq: Two times a day (BID) | ORAL | 3 refills | Status: DC

## 2021-10-08 NOTE — Progress Notes (Signed)
?  ? ?Subjective:  ?Patient ID: Frank Haas, male    DOB: [birth year], 76 y.o.   MRN: [id number] ? ?Patient Care Team: ?Baruch Gouty, FNP as PCP - General (Family Medicine)  ? ?Chief Complaint:  Medical Management of Chronic Issues ? ? ?HPI: ?Frank Haas is a 76 y.o. male presenting on 10/08/2021 for Medical Management of Chronic Issues ? ? ?1. Essential tremor ?Has been doing well on inderal as prescribed. No new or worsening symptoms.  ? ?2. Elevated platelet count ?Denies symptomology. Last platelet count 522. Will recheck today and refer to hematology if warranted.  ? ?3. Hyperkalemia ?Last potassium 5.7, will recheck today.  ? ?4. Weight loss ?Has lost several pounds over last several months. States he has not been eating well as he does not have an appetite. He does eat snacks and sweets. Will eat some of his dinner and breakfast but not as much as he used to.  ? ?5. Lumbar back pain ?6. Multiple joint pain ?Was seen by ortho and advised to use a cane. Has been using with some relief of symptoms. States he still has some arthralgias at times but he cane is beneficial.  ? ? ? ?Relevant past medical, surgical, family, and social history reviewed and updated as indicated.  ?Allergies and medications reviewed and updated. Data reviewed: Chart in Epic. ? ? ?Past Medical History:  ?Diagnosis Date  ? Blind right eye   ? Scoliosis   ? ? ?Past Surgical History:  ?Procedure Laterality Date  ? EYE SURGERY    ? right eye removed  ? ? ?Social History  ? ?Socioeconomic History  ? Marital status: Married  ?  Spouse name: Not on file  ? Number of children: Not on file  ? Years of education: Not on file  ? Highest education level: Not on file  ?Occupational History  ? Not on file  ?Tobacco Use  ? Smoking status: Former  ? Smokeless tobacco: Never  ?Substance and Sexual Activity  ? Alcohol use: No  ? Drug use: No  ? Sexual activity: Not on file  ?Other Topics Concern  ? Not on file  ?Social History Narrative  ? Not on file   ? ?Social Determinants of Health  ? ?Financial Resource Strain: Not on file  ?Food Insecurity: Not on file  ?Transportation Needs: Not on file  ?Physical Activity: Not on file  ?Stress: Not on file  ?Social Connections: Not on file  ?Intimate Partner Violence: Not on file  ? ? ?Outpatient Encounter Medications as of 10/08/2021  ?Medication Sig  ? acetaminophen (TYLENOL) 650 MG CR tablet Take 650 mg by mouth as needed for pain.  ? [DISCONTINUED] propranolol (INDERAL) 10 MG tablet Take 1 tablet by mouth twice daily  ? propranolol (INDERAL) 10 MG tablet Take 1 tablet (10 mg total) by mouth 2 (two) times daily.  ? ?No facility-administered encounter medications on file as of 10/08/2021.  ? ? ?Allergies  ?Allergen Reactions  ? Demerol [Meperidine] Nausea Only  ? ? ?Review of Systems  ?Constitutional:  Positive for unexpected weight change. Negative for activity change, appetite change, chills, diaphoresis, fatigue and fever.  ?HENT: Negative.    ?Eyes: Negative.  Negative for photophobia and visual disturbance.  ?Respiratory:  Negative for cough, chest tightness and shortness of breath.   ?Cardiovascular:  Negative for chest pain, palpitations and leg swelling.  ?Gastrointestinal:  Negative for abdominal distention, abdominal pain, anal bleeding, blood in stool, constipation, diarrhea, nausea, rectal  pain and vomiting.  ?Endocrine: Negative.  Negative for cold intolerance, heat intolerance, polydipsia, polyphagia and polyuria.  ?Genitourinary:  Negative for decreased urine volume, difficulty urinating, dysuria, frequency, penile swelling, scrotal swelling, testicular pain and urgency.  ?Musculoskeletal:  Positive for arthralgias, back pain and gait problem. Negative for joint swelling, myalgias, neck pain and neck stiffness.  ?Skin: Negative.   ?Allergic/Immunologic: Negative.   ?Neurological:  Positive for tremors. Negative for dizziness, seizures, syncope, facial asymmetry, speech difficulty, weakness, light-headedness,  numbness and headaches.  ?Hematological: Negative.   ?Psychiatric/Behavioral:  Negative for confusion, hallucinations, sleep disturbance and suicidal ideas.   ?All other systems reviewed and are negative. ? ?   ? ?Objective:  ?BP (!) 142/90   Pulse 85   Temp 98.4 ?F (36.9 ?C)   Ht 6' (1.829 m)   Wt 171 lb 3.2 oz (77.7 kg)   SpO2 96%   BMI 23.22 kg/m?   ? ?Wt Readings from Last 3 Encounters:  ?10/08/21 171 lb 3.2 oz (77.7 kg)  ?05/30/21 191 lb (86.6 kg)  ?05/28/21 191 lb (86.6 kg)  ? ? ?Physical Exam ?Vitals and nursing note reviewed.  ?Constitutional:   ?   General: He is not in acute distress. ?   Appearance: Normal appearance. He is well-developed, well-groomed and normal weight. He is not ill-appearing, toxic-appearing or diaphoretic.  ?HENT:  ?   Head: Normocephalic and atraumatic.  ?   Jaw: There is normal jaw occlusion.  ?   Right Ear: Hearing normal.  ?   Left Ear: Hearing normal.  ?   Nose: Nose normal.  ?   Mouth/Throat:  ?   Lips: Pink.  ?   Mouth: Mucous membranes are moist.  ?   Pharynx: Oropharynx is clear. Uvula midline.  ?Eyes:  ?   General: Lids are normal.  ?   Comments: Prosthetic right eye  ?Neck:  ?   Thyroid: No thyroid mass, thyromegaly or thyroid tenderness.  ?   Vascular: No carotid bruit or JVD.  ?   Trachea: Trachea and phonation normal.  ?Cardiovascular:  ?   Rate and Rhythm: Normal rate. Extrasystoles are present. ?   Chest Wall: PMI is not displaced.  ?   Pulses: Normal pulses.  ?   Heart sounds: Normal heart sounds. No murmur heard. ?  No friction rub. No gallop.  ?Pulmonary:  ?   Effort: Pulmonary effort is normal. No respiratory distress.  ?   Breath sounds: Normal breath sounds. No wheezing.  ?Abdominal:  ?   General: Bowel sounds are normal. There is no distension or abdominal bruit.  ?   Palpations: Abdomen is soft. There is no hepatomegaly or splenomegaly.  ?   Tenderness: There is no abdominal tenderness.  ?Musculoskeletal:     ?   General: Normal range of motion.  ?    Cervical back: Normal, normal range of motion and neck supple.  ?   Thoracic back: Tenderness present. Scoliosis present.  ?   Lumbar back: Tenderness present. Scoliosis present.  ?   Right lower leg: No edema.  ?   Left lower leg: No edema.  ?Lymphadenopathy:  ?   Cervical: No cervical adenopathy.  ?Skin: ?   General: Skin is warm and dry.  ?   Capillary Refill: Capillary refill takes less than 2 seconds.  ?   Coloration: Skin is not cyanotic, jaundiced or pale.  ?   Findings: No rash.  ?Neurological:  ?   General: No focal deficit  present.  ?   Mental Status: He is alert and oriented to person, place, and time.  ?   Sensory: Sensation is intact.  ?   Motor: Motor function is intact.  ?   Coordination: Coordination is intact.  ?   Gait: Gait abnormal (using cane, antalgic).  ?   Deep Tendon Reflexes: Reflexes are normal and symmetric.  ?Psychiatric:     ?   Attention and Perception: Attention and perception normal.     ?   Mood and Affect: Mood and affect normal.     ?   Speech: Speech normal.     ?   Behavior: Behavior normal. Behavior is cooperative.     ?   Thought Content: Thought content normal.     ?   Cognition and Memory: Cognition and memory normal.     ?   Judgment: Judgment normal.  ? ? ?Results for orders placed or performed in visit on 05/20/21  ?Basic Metabolic Panel  ?Result Value Ref Range  ? Glucose 96 70 - 99 mg/dL  ? BUN 11 8 - 27 mg/dL  ? Creatinine, Ser 1.19 0.76 - 1.27 mg/dL  ? eGFR 64 >59 mL/min/1.73  ? BUN/Creatinine Ratio 9 (L) 10 - 24  ? Sodium 138 134 - 144 mmol/L  ? Potassium 5.1 3.5 - 5.2 mmol/L  ? Chloride 100 96 - 106 mmol/L  ? CO2 23 20 - 29 mmol/L  ? Calcium 9.0 8.6 - 10.2 mg/dL  ? ?  EKG: SR with LAFB, 81, PR 158 ms, QT 388 ms, no acute ST-T changes or ectopy. No prior EKG for comparison. Monia Pouch, FNP-C ? ?Pertinent labs & imaging results that were available during my care of the patient were reviewed by me and considered in my medical decision making. ? ?Assessment & Plan:   ?Carols was seen today for medical management of chronic issues. ? ?Diagnoses and all orders for this visit: ? ?Essential tremor ?Doing well on below. Will continue.  ?-     propranolol (INDERAL) 10 MG tablet; Rich Number

## 2021-10-09 ENCOUNTER — Other Ambulatory Visit: Payer: Self-pay | Admitting: Emergency Medicine

## 2021-10-09 DIAGNOSIS — E875 Hyperkalemia: Secondary | ICD-10-CM

## 2021-10-09 LAB — VITAMIN D 25 HYDROXY (VIT D DEFICIENCY, FRACTURES): Vit D, 25-Hydroxy: 16.3 ng/mL — ABNORMAL LOW (ref 30.0–100.0)

## 2021-10-09 LAB — CMP14+EGFR
ALT: 9 IU/L (ref 0–44)
AST: 16 IU/L (ref 0–40)
Albumin/Globulin Ratio: 1.6 (ref 1.2–2.2)
Albumin: 4.2 g/dL (ref 3.7–4.7)
Alkaline Phosphatase: 73 IU/L (ref 44–121)
BUN/Creatinine Ratio: 8 — ABNORMAL LOW (ref 10–24)
BUN: 10 mg/dL (ref 8–27)
Bilirubin Total: 1.5 mg/dL — ABNORMAL HIGH (ref 0.0–1.2)
CO2: 23 mmol/L (ref 20–29)
Calcium: 9.2 mg/dL (ref 8.6–10.2)
Chloride: 98 mmol/L (ref 96–106)
Creatinine, Ser: 1.19 mg/dL (ref 0.76–1.27)
Globulin, Total: 2.6 g/dL (ref 1.5–4.5)
Glucose: 84 mg/dL (ref 70–99)
Potassium: 5.7 mmol/L — ABNORMAL HIGH (ref 3.5–5.2)
Sodium: 141 mmol/L (ref 134–144)
Total Protein: 6.8 g/dL (ref 6.0–8.5)
eGFR: 64 mL/min/1.73

## 2021-10-09 LAB — CBC WITH DIFFERENTIAL/PLATELET
Basophils Absolute: 1.6 x10E3/uL — ABNORMAL HIGH (ref 0.0–0.2)
Basos: 10 %
EOS (ABSOLUTE): 3.8 x10E3/uL — ABNORMAL HIGH (ref 0.0–0.4)
Eos: 24 %
Hematocrit: 38.3 % (ref 37.5–51.0)
Hemoglobin: 11.9 g/dL — ABNORMAL LOW (ref 13.0–17.7)
Lymphocytes Absolute: 2.2 x10E3/uL (ref 0.7–3.1)
Lymphs: 14 %
MCH: 25.4 pg — ABNORMAL LOW (ref 26.6–33.0)
MCHC: 31.1 g/dL — ABNORMAL LOW (ref 31.5–35.7)
MCV: 82 fL (ref 79–97)
Monocytes Absolute: 0.5 x10E3/uL (ref 0.1–0.9)
Monocytes: 3 %
NRBC: 3 % — ABNORMAL HIGH (ref 0–0)
Neutrophils Absolute: 7.7 x10E3/uL — ABNORMAL HIGH (ref 1.4–7.0)
Neutrophils: 49 %
Platelets: 417 x10E3/uL (ref 150–450)
RBC: 4.69 x10E6/uL (ref 4.14–5.80)
RDW: 32.1 % — ABNORMAL HIGH (ref 11.6–15.4)
WBC: 15.8 x10E3/uL — ABNORMAL HIGH (ref 3.4–10.8)

## 2021-10-09 LAB — THYROID PANEL WITH TSH
Free Thyroxine Index: 2 (ref 1.2–4.9)
T3 Uptake Ratio: 27 % (ref 24–39)
T4, Total: 7.4 ug/dL (ref 4.5–12.0)
TSH: 1.41 u[IU]/mL (ref 0.450–4.500)

## 2021-10-09 MED ORDER — SODIUM POLYSTYRENE SULFONATE 15 GM/60ML PO SUSP: 15.0000 g | Freq: Once | ORAL | 0 refills | Status: AC

## 2021-10-09 MED ORDER — SODIUM POLYSTYRENE SULFONATE PO POWD: Freq: Once | ORAL | 0 refills | Status: DC

## 2021-10-09 NOTE — Addendum Note (Signed)
Addended by: Julious Payer D on: 10/09/2021 02:30 PM ? ? Modules accepted: Orders ? ?

## 2021-10-09 NOTE — Addendum Note (Signed)
Addended by: Sonny Masters on: 10/09/2021 01:22 PM ? ? Modules accepted: Orders ? ?

## 2021-10-09 NOTE — Progress Notes (Signed)
TC from [facility], they do not have the powder, they have the liquid. Recent. ?

## 2021-10-11 ENCOUNTER — Other Ambulatory Visit: Payer: Medicare Other

## 2021-10-11 DIAGNOSIS — E875 Hyperkalemia: Secondary | ICD-10-CM

## 2021-10-11 LAB — CMP14+EGFR
ALT: 10 IU/L (ref 0–44)
AST: 19 IU/L (ref 0–40)
Albumin/Globulin Ratio: 1.6 (ref 1.2–2.2)
Albumin: 4.1 g/dL (ref 3.7–4.7)
Alkaline Phosphatase: 58 IU/L (ref 44–121)
BUN/Creatinine Ratio: 9 — ABNORMAL LOW (ref 10–24)
BUN: 11 mg/dL (ref 8–27)
Bilirubin Total: 1.6 mg/dL — ABNORMAL HIGH (ref 0.0–1.2)
CO2: 26 mmol/L (ref 20–29)
Calcium: 9.1 mg/dL (ref 8.6–10.2)
Chloride: 99 mmol/L (ref 96–106)
Creatinine, Ser: 1.26 mg/dL (ref 0.76–1.27)
Globulin, Total: 2.5 g/dL (ref 1.5–4.5)
Glucose: 116 mg/dL — ABNORMAL HIGH (ref 70–99)
Potassium: 4.9 mmol/L (ref 3.5–5.2)
Sodium: 138 mmol/L (ref 134–144)
Total Protein: 6.6 g/dL (ref 6.0–8.5)
eGFR: 60 mL/min/{1.73_m2} (ref >59)

## 2021-10-18 ENCOUNTER — Ambulatory Visit (INDEPENDENT_AMBULATORY_CARE_PROVIDER_SITE_OTHER): Payer: Medicare Other

## 2021-10-18 ENCOUNTER — Encounter: Payer: Self-pay | Admitting: Family Medicine

## 2021-10-18 ENCOUNTER — Ambulatory Visit (INDEPENDENT_AMBULATORY_CARE_PROVIDER_SITE_OTHER): Payer: Medicare Other | Admitting: Family Medicine

## 2021-10-18 VITALS — BP 139/80 | HR 73 | Temp 98.6°F | Ht 72.0 in | Wt 169.0 lb

## 2021-10-18 DIAGNOSIS — M5441 Lumbago with sciatica, right side: Secondary | ICD-10-CM

## 2021-10-18 DIAGNOSIS — W19XXXA Unspecified fall, initial encounter: Secondary | ICD-10-CM

## 2021-10-18 DIAGNOSIS — M5442 Lumbago with sciatica, left side: Secondary | ICD-10-CM | POA: Diagnosis not present

## 2021-10-18 MED ORDER — METHYLPREDNISOLONE ACETATE 40 MG/ML IJ SUSP
40.0000 mg | Freq: Once | INTRAMUSCULAR | Status: AC
  Administered 2021-10-18: 40 mg via INTRAMUSCULAR

## 2021-10-18 NOTE — Progress Notes (Signed)
Subjective:  Patient ID: Frank Haas, male    DOB: 02-27-1946, 76 y.o.   MRN: [id number]  Patient Care Team: Baruch Gouty, FNP as PCP - General (Family Medicine)   Chief Complaint:  Back Pain [place] 10 days ago.  Missed the bed and hit the floor)   HPI: Frank Haas is a 76 y.o. male presenting on 10/18/2021 for Back Pain (Fell 10 days ago.  Missed the bed and hit the floor)   Pt presents today for worsening lower back pain due to a fall about 1 week ago. States he went to sit on the bed and his feet slipped out from under him and he landed hard on his bottom causing significant back pain which radiates into legs. No loss of bowel or bladder. No saddle anesthesia.   Back Pain This is a new problem. The current episode started in the past 7 days. The problem occurs constantly. The problem is unchanged. The pain is present in the lumbar spine, sacro-iliac and gluteal. The quality of the pain is described as aching, burning and stabbing. The pain radiates to the right thigh and left thigh. The pain is at a severity of 6/10. The pain is moderate. The symptoms are aggravated by stress, twisting, standing, sitting and position. Associated symptoms include leg pain. Pertinent negatives include no abdominal pain, bladder incontinence, bowel incontinence, chest pain, dysuria, fever, headaches, numbness, paresis, paresthesias, pelvic pain, perianal numbness, tingling, weakness or weight loss. He has tried analgesics for the symptoms. The treatment provided no relief.     Relevant past medical, surgical, family, and social history reviewed and updated as indicated.  Allergies and medications reviewed and updated. Data reviewed: Chart in Epic.   Past Medical History:  Diagnosis Date   Blind right eye    Scoliosis     Past Surgical History:  Procedure Laterality Date   EYE SURGERY     right eye removed    Social History   Socioeconomic History   Marital status: Married    Spouse  name: Not on file   Number of children: Not on file   Years of education: Not on file   Highest education level: Not on file  Occupational History   Not on file  Tobacco Use   Smoking status: Former   Smokeless tobacco: Never  Substance and Sexual Activity   Alcohol use: No   Drug use: No   Sexual activity: Not on file  Other Topics Concern   Not on file  Social History Narrative   Not on file   Social Determinants of Health   Financial Resource Strain: Not on file  Food Insecurity: Not on file  Transportation Needs: Not on file  Physical Activity: Not on file  Stress: Not on file  Social Connections: Not on file  Intimate Partner Violence: Not on file    Outpatient Encounter Medications as of 10/18/2021  Medication Sig   acetaminophen (TYLENOL) 650 MG CR tablet Take 650 mg by mouth as needed for pain.   propranolol (INDERAL) 10 MG tablet Take 1 tablet (10 mg total) by mouth 2 (two) times daily.   [EXPIRED] methylPREDNISolone acetate (DEPO-MEDROL) injection 40 mg    No facility-administered encounter medications on file as of 10/18/2021.    Allergies  Allergen Reactions   Demerol [Meperidine] Nausea Only    Review of Systems  Constitutional:  Negative for activity change, appetite change, diaphoresis, fatigue, fever, unexpected weight change and weight loss.  Respiratory:  Negative for cough and shortness of breath.   Cardiovascular:  Negative for chest pain, palpitations and leg swelling.  Gastrointestinal:  Negative for abdominal distention, abdominal pain, anal bleeding, blood in stool, bowel incontinence, constipation, diarrhea, nausea, rectal pain and vomiting.  Genitourinary:  Negative for bladder incontinence, decreased urine volume, difficulty urinating, dysuria and pelvic pain.  Musculoskeletal:  Positive for arthralgias, back pain, gait problem and myalgias. Negative for joint swelling, neck pain and neck stiffness.  Neurological:  Positive for tremors.  Negative for dizziness, tingling, seizures, syncope, facial asymmetry, speech difficulty, weakness, light-headedness, numbness, headaches and paresthesias.  Psychiatric/Behavioral:  Negative for confusion.   All other systems reviewed and are negative.      Objective:  BP 139/80   Pulse 73   Temp 98.6 F (37 C)   Ht 6' (1.829 m)   Wt 169 lb (76.7 kg)   SpO2 97%   BMI 22.92 kg/m    Wt Readings from Last 3 Encounters:  10/18/21 169 lb (76.7 kg)  10/08/21 171 lb 3.2 oz (77.7 kg)  05/30/21 191 lb (86.6 kg)    Physical Exam Vitals and nursing note reviewed.  Constitutional:      General: He is not in acute distress.    Appearance: Normal appearance. He is not ill-appearing, toxic-appearing or diaphoretic.  HENT:     Head: Normocephalic and atraumatic.  Eyes:     Comments: Prosthetic right eye  Cardiovascular:     Rate and Rhythm: Normal rate and regular rhythm.     Heart sounds: Normal heart sounds. No murmur heard.   No friction rub. No gallop.  Pulmonary:     Effort: Pulmonary effort is normal.     Breath sounds: Normal breath sounds.  Musculoskeletal:     Cervical back: Normal.     Thoracic back: Tenderness present. Decreased range of motion. Scoliosis present.     Lumbar back: Tenderness present. Decreased range of motion. Positive right straight leg raise test and positive left straight leg raise test. Scoliosis present.     Right hip: Normal.     Left hip: Normal.  Skin:    General: Skin is warm and dry.     Capillary Refill: Capillary refill takes less than 2 seconds.  Neurological:     General: No focal deficit present.     Mental Status: He is alert and oriented to person, place, and time.     Motor: Tremor present.     Gait: Gait abnormal (in wheelchair).  Psychiatric:        Mood and Affect: Mood normal.        Behavior: Behavior normal.        Thought Content: Thought content normal.        Judgment: Judgment normal.    Results for orders placed or  performed in visit on 10/11/21  CMP14+EGFR  Result Value Ref Range   Glucose 116 (H) 70 - 99 mg/dL   BUN 11 8 - 27 mg/dL   Creatinine, Ser 1.26 0.76 - 1.27 mg/dL   eGFR 60 >59 mL/min/1.73   BUN/Creatinine Ratio 9 (L) 10 - 24   Sodium 138 134 - 144 mmol/L   Potassium 4.9 3.5 - 5.2 mmol/L   Chloride 99 96 - 106 mmol/L   CO2 26 20 - 29 mmol/L   Calcium 9.1 8.6 - 10.2 mg/dL   Total Protein 6.6 6.0 - 8.5 g/dL   Albumin 4.1 3.7 - 4.7 g/dL   Globulin, Total  2.5 1.5 - 4.5 g/dL   Albumin/Globulin Ratio 1.6 1.2 - 2.2   Bilirubin Total 1.6 (H) 0.0 - 1.2 mg/dL   Alkaline Phosphatase 58 44 - 121 IU/L   AST 19 0 - 40 IU/L   ALT 10 0 - 44 IU/L     X-Ray: lumbar spine: concerning for compression fractures of L4 and L5, worsening degenerative changes since last imaging. Preliminary x-ray reading by Monia Pouch, FNP-C, [facility].   Pertinent labs & imaging results that were available during my care of the patient were reviewed by me and considered in my medical decision making.  Assessment & Plan:  Aleksey was seen today for back pain.  Diagnoses and all orders for this visit:  Fall, initial encounter Acute bilateral low back pain with bilateral sciatica Imaging concerning for compression fractures of L4 and L5. Worsening degenerative changes since last imaging. No red flags concerning for cauda equina syndrome. Urgent referral to neurosurgery. Burst of steroids in office today. Continue tylenol as needed for pain. Aware of red flags which require emergent evaluation.  -     DG Lumbar Spine 2-3 Views -     Ambulatory referral to Neurosurgery -     methylPREDNISolone acetate (DEPO-MEDROL) injection 40 mg       Continue all other maintenance medications.  Follow up plan: Return if symptoms worsen or fail to improve.   Continue healthy lifestyle choices, including diet (rich in fruits, vegetables, and lean proteins, and low in salt and simple carbohydrates) and exercise (at least 30 minutes  of moderate physical activity daily).   The above assessment and management plan was discussed with the patient. The patient verbalized understanding of and has agreed to the management plan. Patient is aware to call the clinic if they develop any new symptoms or if symptoms persist or worsen. Patient is aware when to return to the clinic for a follow-up visit. Patient educated on when it is appropriate to go to the emergency department.   Monia Pouch, FNP-C [facility] [phone]

## 2021-12-04 ENCOUNTER — Encounter: Payer: Self-pay | Admitting: Family Medicine

## 2021-12-04 ENCOUNTER — Ambulatory Visit (INDEPENDENT_AMBULATORY_CARE_PROVIDER_SITE_OTHER): Payer: Medicare Other | Admitting: Family Medicine

## 2021-12-04 VITALS — BP 136/72 | HR 71 | Temp 98.8°F | Ht 72.0 in | Wt 168.0 lb

## 2021-12-04 DIAGNOSIS — I700 Atherosclerosis of aorta: Secondary | ICD-10-CM | POA: Insufficient documentation

## 2021-12-04 DIAGNOSIS — S32040A Wedge compression fracture of fourth lumbar vertebra, initial encounter for closed fracture: Secondary | ICD-10-CM

## 2021-12-04 DIAGNOSIS — M545 Low back pain, unspecified: Secondary | ICD-10-CM | POA: Diagnosis not present

## 2021-12-04 MED ORDER — ASPIRIN 81 MG PO TBEC: 81.0000 mg | DELAYED_RELEASE_TABLET | Freq: Every day | ORAL | 12 refills | Status: AC

## 2021-12-04 MED ORDER — ATORVASTATIN CALCIUM 10 MG PO TABS: 10.0000 mg | ORAL_TABLET | Freq: Every day | ORAL | 3 refills | Status: DC

## 2021-12-04 NOTE — Progress Notes (Signed)
Subjective:  Patient ID: Frank Haas, male    DOB: 06/26/1945, 76 y.o.   MRN: [id number]  Patient Care Team: Baruch Gouty, FNP as PCP - General (Family Medicine)   Chief Complaint:  Back Pain   HPI: Frank Haas is a 76 y.o. male presenting on 12/04/2021 for Back Pain   Pt presents today for follow up of back pain. He had a fall and imaging on 10/18/2021 revealed a new L4 compression fracture. He was referred to neurosurgery but has not seen them to date. Has been taking Tylenol with significant relief of pain. Aortic atherosclerosis noted on imaging.   Back Pain This is a recurrent problem. The current episode started more than 1 month ago. The problem has been gradually improving since onset. The pain is present in the lumbar spine. The quality of the pain is described as aching. The pain is mild. The symptoms are aggravated by bending and twisting. Pertinent negatives include no abdominal pain, bladder incontinence, bowel incontinence, chest pain, dysuria, fever, headaches, leg pain, numbness, paresis, paresthesias, pelvic pain, perianal numbness, tingling, weakness or weight loss. He has tried analgesics for the symptoms. The treatment provided significant relief.    Relevant past medical, surgical, family, and social history reviewed and updated as indicated.  Allergies and medications reviewed and updated. Data reviewed: Chart in Epic.   Past Medical History:  Diagnosis Date   Blind right eye    Scoliosis     Past Surgical History:  Procedure Laterality Date   EYE SURGERY     right eye removed    Social History   Socioeconomic History   Marital status: Married    Spouse name: Not on file   Number of children: Not on file   Years of education: Not on file   Highest education level: Not on file  Occupational History   Not on file  Tobacco Use   Smoking status: Former   Smokeless tobacco: Never  Substance and Sexual Activity   Alcohol use: No   Drug use:  No   Sexual activity: Not on file  Other Topics Concern   Not on file  Social History Narrative   Not on file   Social Determinants of Health   Financial Resource Strain: Not on file  Food Insecurity: Not on file  Transportation Haas: Not on file  Physical Activity: Not on file  Stress: Not on file  Social Connections: Not on file  Intimate Partner Violence: Not on file    Outpatient Encounter Medications as of 12/04/2021  Medication Sig   acetaminophen (TYLENOL) 650 MG CR tablet Take 650 mg by mouth as needed for pain.   aspirin EC 81 MG tablet Take 1 tablet (81 mg total) by mouth daily. Swallow whole.   atorvastatin (LIPITOR) 10 MG tablet Take 1 tablet (10 mg total) by mouth at bedtime.   propranolol (INDERAL) 10 MG tablet Take 1 tablet (10 mg total) by mouth 2 (two) times daily.   No facility-administered encounter medications on file as of 12/04/2021.    Allergies  Allergen Reactions   Demerol [Meperidine] Nausea Only    Review of Systems  Constitutional:  Positive for activity change. Negative for appetite change, chills, diaphoresis, fatigue, fever, unexpected weight change and weight loss.  Eyes:  Negative for photophobia and visual disturbance.  Respiratory:  Negative for cough and shortness of breath.   Cardiovascular:  Negative for chest pain, palpitations and leg swelling.  Gastrointestinal:  Negative for abdominal pain and bowel incontinence.  Genitourinary:  Negative for bladder incontinence, decreased urine volume, dysuria and pelvic pain.  Musculoskeletal:  Positive for arthralgias, back pain and gait problem. Negative for joint swelling, myalgias, neck pain and neck stiffness.  Neurological:  Negative for dizziness, tingling, tremors, seizures, syncope, facial asymmetry, speech difficulty, weakness, light-headedness, numbness, headaches and paresthesias.  Psychiatric/Behavioral:  Negative for confusion.   All other systems reviewed and are negative.        Objective:  BP 136/72   Pulse 71   Temp 98.8 F (37.1 C)   Ht 6' (1.829 m)   Wt 168 lb (76.2 kg)   SpO2 95%   BMI 22.78 kg/m    Wt Readings from Last 3 Encounters:  12/04/21 168 lb (76.2 kg)  10/18/21 169 lb (76.7 kg)  10/08/21 171 lb 3.2 oz (77.7 kg)    Physical Exam Vitals and nursing note reviewed.  Constitutional:      General: He is not in acute distress.    Appearance: Normal appearance. He is normal weight. He is not ill-appearing, toxic-appearing or diaphoretic.  HENT:     Head: Normocephalic and atraumatic.     Mouth/Throat:     Mouth: Mucous membranes are moist.  Eyes:     Comments: Right eye prosthesis  Cardiovascular:     Rate and Rhythm: Normal rate and regular rhythm.     Heart sounds: Normal heart sounds. No murmur heard.    No friction rub. No gallop.  Pulmonary:     Effort: Pulmonary effort is normal.     Breath sounds: Normal breath sounds.  Musculoskeletal:     Cervical back: Normal.     Thoracic back: Scoliosis present.     Lumbar back: Tenderness present. No swelling, edema, deformity, signs of trauma, lacerations, spasms or bony tenderness. Decreased range of motion. Negative right straight leg raise test and negative left straight leg raise test. Scoliosis present.     Right hip: Normal.     Left hip: Normal.  Skin:    General: Skin is warm and dry.     Capillary Refill: Capillary refill takes less than 2 seconds.  Neurological:     General: No focal deficit present.     Mental Status: He is alert and oriented to person, place, and time.     Gait: Gait abnormal (antalgic, using cane).  Psychiatric:        Mood and Affect: Mood normal.        Behavior: Behavior normal.        Thought Content: Thought content normal.        Judgment: Judgment normal.     Results for orders placed or performed in visit on 10/11/21  CMP14+EGFR  Result Value Ref Range   Glucose 116 (H) 70 - 99 mg/dL   BUN 11 8 - 27 mg/dL   Creatinine, Ser 1.26 0.76 -  1.27 mg/dL   eGFR 60 >59 mL/min/1.73   BUN/Creatinine Ratio 9 (L) 10 - 24   Sodium 138 134 - 144 mmol/L   Potassium 4.9 3.5 - 5.2 mmol/L   Chloride 99 96 - 106 mmol/L   CO2 26 20 - 29 mmol/L   Calcium 9.1 8.6 - 10.2 mg/dL   Total Protein 6.6 6.0 - 8.5 g/dL   Albumin 4.1 3.7 - 4.7 g/dL   Globulin, Total 2.5 1.5 - 4.5 g/dL   Albumin/Globulin Ratio 1.6 1.2 - 2.2   Bilirubin Total 1.6 (H) 0.0 -  1.2 mg/dL   Alkaline Phosphatase 58 44 - 121 IU/L   AST 19 0 - 40 IU/L   ALT 10 0 - 44 IU/L       Pertinent labs & imaging results that were available during my care of the patient were reviewed by me and considered in my medical decision making.  Assessment & Plan:  Rodel was seen today for back pain.  Diagnoses and all orders for this visit:  Lumbar back pain Compression fracture of L4 vertebra, initial encounter ([place]) Pain has improved with Tylenol. Has not seen neurosurgery, will reach out to referrals department today to see when pt can be seen. No red flags present.   Atherosclerosis of aorta ([place]) Noted on imaging. Will start ASA and statin therapy.  -     aspirin EC 81 MG tablet; Take 1 tablet (81 mg total) by mouth daily. Swallow whole. -     atorvastatin (LIPITOR) 10 MG tablet; Take 1 tablet (10 mg total) by mouth at bedtime.     Continue all other maintenance medications.  Follow up plan: Return in about 4 months (around 04/06/2022), or if symptoms worsen or fail to improve, for labs, chronic follow up.   Continue healthy lifestyle choices, including diet (rich in fruits, vegetables, and lean proteins, and low in salt and simple carbohydrates) and exercise (at least 30 minutes of moderate physical activity daily).    The above assessment and management plan was discussed with the patient. The patient verbalized understanding of and has agreed to the management plan. Patient is aware to call the clinic if they develop any new symptoms or if symptoms persist or worsen.  Patient is aware when to return to the clinic for a follow-up visit. Patient educated on when it is appropriate to go to the emergency department.   Monia Pouch, FNP-C [facility] [phone]

## 2021-12-10 ENCOUNTER — Ambulatory Visit: Payer: Medicare Other

## 2021-12-23 ENCOUNTER — Ambulatory Visit (INDEPENDENT_AMBULATORY_CARE_PROVIDER_SITE_OTHER): Payer: Medicare Other

## 2021-12-23 DIAGNOSIS — Z23 Encounter for immunization: Secondary | ICD-10-CM

## 2022-02-25 ENCOUNTER — Telehealth: Payer: Self-pay | Admitting: Family Medicine

## 2022-02-25 DIAGNOSIS — G250 Essential tremor: Secondary | ICD-10-CM

## 2022-02-25 DIAGNOSIS — I700 Atherosclerosis of aorta: Secondary | ICD-10-CM

## 2022-02-25 MED ORDER — PROPRANOLOL HCL 10 MG PO TABS: 10.0000 mg | ORAL_TABLET | Freq: Two times a day (BID) | ORAL | 1 refills | Status: DC

## 2022-02-25 MED ORDER — ATORVASTATIN CALCIUM 10 MG PO TABS: 10.0000 mg | ORAL_TABLET | Freq: Every day | ORAL | 1 refills | Status: DC

## 2022-02-25 NOTE — Telephone Encounter (Signed)
  Prescription Request  02/25/2022  Is this a "Controlled Substance" medicine? no  Have you seen your PCP in the last 2 weeks? Appt 11/7  If YES, route message to pool  -  If NO, patient needs to be scheduled for appointment.  What is the name of the medication or equipment? atorvastatin (LIPITOR) 10 MG tablet propranolol (INDERAL) 10 MG tablet  Have you contacted your pharmacy to request a refill? Yes, switching pharmacy    Which pharmacy would you like this sent to? [facility] in [place]   Patient notified that their request is being sent to the clinical staff for review and that they should receive a response within 2 business days.

## 2022-02-25 NOTE — Telephone Encounter (Signed)
Aware meds sent to [facility]

## 2022-04-08 ENCOUNTER — Ambulatory Visit (INDEPENDENT_AMBULATORY_CARE_PROVIDER_SITE_OTHER): Payer: Medicare Other | Admitting: Family Medicine

## 2022-04-08 ENCOUNTER — Encounter: Payer: Self-pay | Admitting: Family Medicine

## 2022-04-08 VITALS — BP 151/89 | HR 69 | Temp 97.9°F | Ht 73.0 in | Wt 177.0 lb

## 2022-04-08 DIAGNOSIS — R7989 Other specified abnormal findings of blood chemistry: Secondary | ICD-10-CM

## 2022-04-08 DIAGNOSIS — D72828 Other elevated white blood cell count: Secondary | ICD-10-CM

## 2022-04-08 DIAGNOSIS — I700 Atherosclerosis of aorta: Secondary | ICD-10-CM | POA: Diagnosis not present

## 2022-04-08 DIAGNOSIS — G250 Essential tremor: Secondary | ICD-10-CM

## 2022-04-08 DIAGNOSIS — Z23 Encounter for immunization: Secondary | ICD-10-CM

## 2022-04-08 DIAGNOSIS — I10 Essential (primary) hypertension: Secondary | ICD-10-CM | POA: Diagnosis not present

## 2022-04-08 MED ORDER — PROPRANOLOL HCL 20 MG PO TABS: 20.0000 mg | ORAL_TABLET | Freq: Two times a day (BID) | ORAL | 3 refills | Status: DC

## 2022-04-08 NOTE — Progress Notes (Signed)
Subjective:  Patient ID: Frank Haas, male    DOB: 1946/04/20, 76 y.o.   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 Essential tremor Has been taking propranolol 10 mg twice daily  for tremor with some improvement in symptoms, still present and worse with certain activities.   2. Atherosclerosis of aorta ([place]) Has been taking ASA and statin therapy as prescribed. Denies myalgias from statin therapy. No reported chest pain, palpitations, shortness of breath, weakness, or fatigue.   3. Primary hypertension Last 3 visits with noted elevated readings. Has been on BB therapy for tremor which has been slightly beneficial for blood pressure. Does not follow a diet or exercise routine. Denies chest pain, leg swelling, headaches, weakness, or confusion.   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 States he got something behind his prothesis which caused an infection. Was seen by Dr. Marin Comment and placed on drops. States since this time the drainage has subsided. States the socket is still a little red but he is unable to get his prothesis to stay in the socket. States it keeps falling out. Has not followed up with Dr. Katy Fitch.      Relevant past medical, surgical, family, and social history reviewed and updated as indicated.  Allergies and medications reviewed and updated. Data reviewed: Chart in Epic.   Past Medical History:  Diagnosis Date   Blind right eye    Scoliosis     Past Surgical History:  Procedure Laterality Date   EYE SURGERY     right eye removed    Social History   Socioeconomic History   Marital status:  Married    Spouse name: Not on file   Number of children: Not on file   Years of education: Not on file   Highest education level: Not on file  Occupational History   Not on file  Tobacco Use   Smoking status: Former   Smokeless tobacco: Never  Substance and Sexual Activity   Alcohol use: No   Drug use: No   Sexual activity: Not on file  Other Topics Concern   Not on file  Social History Narrative   Not on file   Social Determinants of Health   Financial Resource Strain: Not on file  Food Insecurity: Not on file  Transportation Needs: Not on file  Physical Activity: Not on file  Stress: Not on file  Social Connections: Not on file  Intimate Partner Violence: Not on file    Outpatient Encounter Medications as of 04/08/2022  Medication Sig   acetaminophen (TYLENOL) 650 MG CR tablet Take 650 mg by mouth as needed for pain.   aspirin EC 81 MG tablet Take 1 tablet (81 mg total) by mouth daily. Swallow whole.   atorvastatin (LIPITOR) 10 MG tablet Take 1 tablet (10 mg total) by mouth at bedtime.   propranolol (INDERAL) 20 MG tablet Take 1 tablet (20 mg total) by mouth 2 (two) times daily.   [DISCONTINUED] propranolol (INDERAL) 10 MG tablet Take 1 tablet (10 mg total) by mouth  2 (two) times daily.   No facility-administered encounter medications on file as of 04/08/2022.    Allergies  Allergen Reactions   Demerol [Meperidine] Nausea Only    Review of Systems  Constitutional:  Negative for activity change, appetite change, chills, fatigue and fever.  HENT: Negative.    Eyes:  Positive for redness. Negative for photophobia and visual disturbance.  Respiratory:  Negative for cough, chest tightness and shortness of breath.   Cardiovascular:  Negative for chest pain, palpitations and leg swelling.  Gastrointestinal:  Negative for abdominal pain, blood in stool, constipation, diarrhea, nausea and vomiting.  Endocrine: Negative.   Genitourinary:  Negative for decreased urine  volume, difficulty urinating, dysuria, frequency and urgency.  Musculoskeletal:  Positive for arthralgias, back pain and gait problem. Negative for myalgias.  Skin: Negative.   Allergic/Immunologic: Negative.   Neurological:  Positive for tremors. Negative for dizziness, seizures, syncope, facial asymmetry, speech difficulty, weakness, light-headedness, numbness and headaches.  Hematological: Negative.   Psychiatric/Behavioral:  Negative for confusion, hallucinations, sleep disturbance and suicidal ideas.   All other systems reviewed and are negative.       Objective:  BP (!) 151/89   Pulse 69   Temp 97.9 F (36.6 C) (Temporal)   Ht _0  (1.854 m)   Wt 177 lb (80.3 kg)   SpO2 96%   BMI 23.35 kg/m    Wt Readings from Last 3 Encounters:  04/08/22 177 lb (80.3 kg)  12/04/21 168 lb (76.2 kg)  10/18/21 169 lb (76.7 kg)    Physical Exam Vitals and nursing note reviewed.  Constitutional:      General: He is not in acute distress.    Appearance: Normal appearance. He is normal weight. He is not ill-appearing, toxic-appearing or diaphoretic.  HENT:     Head: Normocephalic and atraumatic.     Nose: Nose normal.     Mouth/Throat:     Mouth: Mucous membranes are moist.  Eyes:     General: Lids are normal.     Comments: Prosthesis not in right eye, redness to socket. No drainage.  Cardiovascular:     Rate and Rhythm: Normal rate and regular rhythm.     Heart sounds: Normal heart sounds.  Pulmonary:     Effort: Pulmonary effort is normal.     Breath sounds: Normal breath sounds.  Musculoskeletal:     Cervical back: Neck supple.     Right lower leg: No edema.     Left lower leg: No edema.     Comments: Scoliosis, kyphosis  Skin:    General: Skin is warm and dry.     Capillary Refill: Capillary refill takes less than 2 seconds.  Neurological:     General: No focal deficit present.     Mental Status: He is alert and oriented to person, place, and time.     Motor: Tremor  present.     Gait: Gait abnormal (using cane).  Psychiatric:        Mood and Affect: Mood normal.        Behavior: Behavior normal.        Thought Content: Thought content normal.        Judgment: Judgment normal.     Results for orders placed or performed in visit on 10/11/21  CMP14+EGFR  Result Value Ref Range   Glucose 116 (H) 70 - 99 mg/dL   BUN 11 8 - 27 mg/dL   Creatinine, Ser 1.26 0.76 - 1.27 mg/dL  eGFR 60 >59 mL/min/1.73   BUN/Creatinine Ratio 9 (L) 10 - 24   Sodium 138 134 - 144 mmol/L   Potassium 4.9 3.5 - 5.2 mmol/L   Chloride 99 96 - 106 mmol/L   CO2 26 20 - 29 mmol/L   Calcium 9.1 8.6 - 10.2 mg/dL   Total Protein 6.6 6.0 - 8.5 g/dL   Albumin 4.1 3.7 - 4.7 g/dL   Globulin, Total 2.5 1.5 - 4.5 g/dL   Albumin/Globulin Ratio 1.6 1.2 - 2.2   Bilirubin Total 1.6 (H) 0.0 - 1.2 mg/dL   Alkaline Phosphatase 58 44 - 121 IU/L   AST 19 0 - 40 IU/L   ALT 10 0 - 44 IU/L       Pertinent labs & imaging results that were available during my care of the patient were reviewed by me and considered in my medical decision making.  Assessment & Plan:  Sloane was seen today for medical management of chronic issues.  Diagnoses and all orders for this visit:  Essential tremor Improved but still present. Will increase propranolol dosing to 20 mg twice daily. Will recheck thyroid function today.  -     Thyroid Panel With TSH -     propranolol (INDERAL) 20 MG tablet; Take 1 tablet (20 mg total) by mouth 2 (two) times daily.  Atherosclerosis of aorta ([facility]) Diet and exercise encouraged. Labs pending. Continue ASA and statin therapy.  -     CBC with Differential/Platelet -     CMP14+EGFR -     Lipid panel  Primary hypertension Blood pressure remains slightly elevated and pt still symptomatic from tremor. Will increase BB dosing to 20 mg twice daily for dual benefit. DASH diet and exercise encouraged.  -     CBC with Differential/Platelet -     CMP14+EGFR -     Lipid panel -      Thyroid Panel With TSH -     propranolol (INDERAL) 20 MG tablet; Take 1 tablet (20 mg total) by mouth 2 (two) times daily.  Blind in right eye Pt aware to schedule a follow up with Dr. Katy Fitch as he is having issues with his prosthetic fitting.   Need for immunization against influenza -     Flu Vaccine QUAD High Dose(Fluad)     Continue all other maintenance medications.  Follow up plan: Return in about 6 months (around 10/07/2022), or if symptoms worsen or fail to improve.   Continue healthy lifestyle choices, including diet (rich in fruits, vegetables, and lean proteins, and low in salt and simple carbohydrates) and exercise (at least 30 minutes of moderate physical activity daily).   The above assessment and management plan was discussed with the patient. The patient verbalized understanding of and has agreed to the management plan. Patient is aware to call the clinic if they develop any new symptoms or if symptoms persist or worsen. Patient is aware when to return to the clinic for a follow-up visit. Patient educated on when it is appropriate to go to the emergency department.   Monia Pouch, FNP-C [facility] [phone]

## 2022-04-09 LAB — CMP14+EGFR
ALT: 27 IU/L (ref 0–44)
AST: 27 IU/L (ref 0–40)
Albumin/Globulin Ratio: 2.3 — ABNORMAL HIGH (ref 1.2–2.2)
Albumin: 4.8 g/dL (ref 3.8–4.8)
Alkaline Phosphatase: 63 IU/L (ref 44–121)
BUN/Creatinine Ratio: 11 (ref 10–24)
BUN: 12 mg/dL (ref 8–27)
Bilirubin Total: 1.7 mg/dL — ABNORMAL HIGH (ref 0.0–1.2)
CO2: 26 mmol/L (ref 20–29)
Calcium: 9.8 mg/dL (ref 8.6–10.2)
Chloride: 101 mmol/L (ref 96–106)
Creatinine, Ser: 1.09 mg/dL (ref 0.76–1.27)
Globulin, Total: 2.1 g/dL (ref 1.5–4.5)
Glucose: 91 mg/dL (ref 70–99)
Potassium: 5.8 mmol/L — ABNORMAL HIGH (ref 3.5–5.2)
Sodium: 141 mmol/L (ref 134–144)
Total Protein: 6.9 g/dL (ref 6.0–8.5)
eGFR: 71 mL/min/{1.73_m2} (ref >59)

## 2022-04-09 LAB — CBC WITH DIFFERENTIAL/PLATELET
Basophils Absolute: 0.8 10*3/uL — ABNORMAL HIGH (ref 0.0–0.2)
Basos: 5 %
EOS (ABSOLUTE): 3.5 10*3/uL — ABNORMAL HIGH (ref 0.0–0.4)
Eos: 21 %
Hematocrit: 45 % (ref 37.5–51.0)
Hemoglobin: 13.9 g/dL (ref 13.0–17.7)
Immature Grans (Abs): 0.2 10*3/uL — ABNORMAL HIGH (ref 0.0–0.1)
Immature Granulocytes: 1 %
Lymphocytes Absolute: 2.6 10*3/uL (ref 0.7–3.1)
Lymphs: 15 %
MCH: 25 pg — ABNORMAL LOW (ref 26.6–33.0)
MCHC: 30.9 g/dL — ABNORMAL LOW (ref 31.5–35.7)
MCV: 81 fL (ref 79–97)
Monocytes Absolute: 0.3 10*3/uL (ref 0.1–0.9)
Monocytes: 2 %
NRBC: 1 % — ABNORMAL HIGH (ref 0–0)
Neutrophils Absolute: 9.5 10*3/uL — ABNORMAL HIGH (ref 1.4–7.0)
Neutrophils: 56 %
Platelets: 432 10*3/uL (ref 150–450)
RBC: 5.55 x10E6/uL (ref 4.14–5.80)
RDW: 30.4 % — ABNORMAL HIGH (ref 11.6–15.4)
WBC: 16.8 10*3/uL — ABNORMAL HIGH (ref 3.4–10.8)

## 2022-04-09 LAB — THYROID PANEL WITH TSH
Free Thyroxine Index: 1.9 (ref 1.2–4.9)
T3 Uptake Ratio: 25 % (ref 24–39)
T4, Total: 7.7 ug/dL (ref 4.5–12.0)
TSH: 1.54 u[IU]/mL (ref 0.450–4.500)

## 2022-04-09 LAB — LIPID PANEL
Chol/HDL Ratio: 3.3 ratio (ref 0.0–5.0)
Cholesterol, Total: 117 mg/dL (ref 100–199)
HDL: 35 mg/dL — ABNORMAL LOW (ref >39)
LDL Chol Calc (NIH): 62 mg/dL (ref 0–99)
Triglycerides: 105 mg/dL (ref 0–149)
VLDL Cholesterol Cal: 20 mg/dL (ref 5–40)

## 2022-04-09 NOTE — Addendum Note (Signed)
Addended by: Sonny Masters on: 04/09/2022 01:11 PM   Modules accepted: Orders

## 2022-04-17 ENCOUNTER — Other Ambulatory Visit: Payer: Self-pay | Admitting: *Deleted

## 2022-04-17 DIAGNOSIS — E875 Hyperkalemia: Secondary | ICD-10-CM

## 2022-04-28 ENCOUNTER — Other Ambulatory Visit: Payer: Medicare Other

## 2022-04-28 DIAGNOSIS — E875 Hyperkalemia: Secondary | ICD-10-CM

## 2022-04-28 LAB — BASIC METABOLIC PANEL
BUN/Creatinine Ratio: 7 — ABNORMAL LOW (ref 10–24)
BUN: 8 mg/dL (ref 8–27)
CO2: 25 mmol/L (ref 20–29)
Calcium: 9.3 mg/dL (ref 8.6–10.2)
Chloride: 102 mmol/L (ref 96–106)
Creatinine, Ser: 1.17 mg/dL (ref 0.76–1.27)
Glucose: 95 mg/dL (ref 70–99)
Potassium: 5.5 mmol/L — ABNORMAL HIGH (ref 3.5–5.2)
Sodium: 142 mmol/L (ref 134–144)
eGFR: 65 mL/min/{1.73_m2} (ref >59)

## 2022-05-06 ENCOUNTER — Inpatient Hospital Stay: Payer: Medicare Other

## 2022-05-06 ENCOUNTER — Inpatient Hospital Stay: Payer: Medicare Other | Attending: Hematology | Admitting: Hematology

## 2022-05-06 ENCOUNTER — Encounter: Payer: Self-pay | Admitting: Hematology

## 2022-05-06 VITALS — BP 132/65 | HR 61 | Temp 97.9°F | Resp 18 | Ht 71.0 in | Wt 180.8 lb

## 2022-05-06 DIAGNOSIS — D72829 Elevated white blood cell count, unspecified: Secondary | ICD-10-CM | POA: Diagnosis present

## 2022-05-06 DIAGNOSIS — G250 Essential tremor: Secondary | ICD-10-CM | POA: Diagnosis not present

## 2022-05-06 DIAGNOSIS — H5461 Unqualified visual loss, right eye, normal vision left eye: Secondary | ICD-10-CM | POA: Insufficient documentation

## 2022-05-06 DIAGNOSIS — Z87891 Personal history of nicotine dependence: Secondary | ICD-10-CM | POA: Diagnosis not present

## 2022-05-06 DIAGNOSIS — I10 Essential (primary) hypertension: Secondary | ICD-10-CM | POA: Diagnosis not present

## 2022-05-06 DIAGNOSIS — Z79899 Other long term (current) drug therapy: Secondary | ICD-10-CM | POA: Insufficient documentation

## 2022-05-06 DIAGNOSIS — E785 Hyperlipidemia, unspecified: Secondary | ICD-10-CM | POA: Insufficient documentation

## 2022-05-06 DIAGNOSIS — D72828 Other elevated white blood cell count: Secondary | ICD-10-CM

## 2022-05-06 LAB — CBC WITH DIFFERENTIAL/PLATELET
Abs Immature Granulocytes: 0 10*3/uL (ref 0.00–0.07)
Basophils Absolute: 0 10*3/uL (ref 0.0–0.1)
Basophils Relative: 0 %
Eosinophils Absolute: 4.3 10*3/uL — ABNORMAL HIGH (ref 0.0–0.5)
Eosinophils Relative: 22 %
HCT: 46.9 % (ref 39.0–52.0)
Hemoglobin: 14.1 g/dL (ref 13.0–17.0)
Lymphocytes Relative: 14 %
Lymphs Abs: 2.8 10*3/uL (ref 0.7–4.0)
MCH: 25 pg — ABNORMAL LOW (ref 26.0–34.0)
MCHC: 30.1 g/dL (ref 30.0–36.0)
MCV: 83 fL (ref 80.0–100.0)
Monocytes Absolute: 0.4 10*3/uL (ref 0.1–1.0)
Monocytes Relative: 2 %
Neutro Abs: 12.2 10*3/uL — ABNORMAL HIGH (ref 1.7–7.7)
Neutrophils Relative %: 62 %
Platelets: 557 10*3/uL — ABNORMAL HIGH (ref 150–400)
RBC: 5.65 MIL/uL (ref 4.22–5.81)
RDW: 34.2 % — ABNORMAL HIGH (ref 11.5–15.5)
WBC: 19.7 10*3/uL — ABNORMAL HIGH (ref 4.0–10.5)
nRBC: 0.4 % — ABNORMAL HIGH (ref 0.0–0.2)

## 2022-05-06 LAB — LACTATE DEHYDROGENASE: LDH: 240 U/L — ABNORMAL HIGH (ref 98–192)

## 2022-05-06 NOTE — Patient Instructions (Signed)
[facility] at [facility] **VISIT SUMMARY & IMPORTANT INSTRUCTIONS **   You were seen today by Dr. Ellin Saba and Rojelio Brenner PA-C for your elevated white blood cells.   We do not yet know the cause of your high white blood cells, but we will check some blood work TODAY. We will see you for follow-up visit in 3 to 4 weeks to discuss these results and next steps.  ** Thank you for trusting me with your healthcare!  I strive to provide all of my patients with quality care at each visit.  If you receive a survey for this visit, I would be so grateful to you for taking the time to provide feedback.  Thank you in advance!  ~ Marlyne Totaro                   Dr. Doreatha Massed   &   Rojelio Brenner, PA-C   - - - - - - - - - - - - - - - - - -    Thank you for choosing [facility] at [facility] to provide your oncology and hematology care.  To afford each patient quality time with our provider, please arrive at least 15 minutes before your scheduled appointment time.   If you have a lab appointment with the Cancer Center please come in thru the Main Entrance and check in at the main information desk.  You need to re-schedule your appointment should you arrive 10 or more minutes late.  We strive to give you quality time with our providers, and arriving late affects you and other patients whose appointments are after yours.  Also, if you no show three or more times for appointments you may be dismissed from the clinic at the providers discretion.     Again, thank you for choosing [facility].  Our hope is that these requests will decrease the amount of time that you wait before being seen by our physicians.       _____________________________________________________________  Should you have questions after your visit to [facility], please contact our office at [phone] and follow the prompts.  Our office hours are 8:00  a.m. and 4:30 p.m. Monday - Friday.  Please note that voicemails left after 4:00 p.m. may not be returned until the following business day.  We are closed weekends and major holidays.  You do have access to a nurse 24-7, just call the main number to the clinic [phone] and do not press any options, hold on the line and a nurse will answer the phone.    For prescription refill requests, have your pharmacy contact our office and allow 72 hours.

## 2022-05-06 NOTE — Progress Notes (Signed)
[place], [place] [address]   CLINIC:  Medical Oncology/Hematology  CONSULT NOTE  Patient Care Team: Rakes, Connye Burkitt, FNP as PCP - General (Family Medicine)  CHIEF COMPLAINTS/PURPOSE OF CONSULTATION:  Abnormal CBC (leukocytosis and thrombocytosis)  HISTORY OF PRESENTING ILLNESS:  Frank Haas 76 y.o. male is here at the request of his PCP (FNP Darla Lesches) for evaluation of leukocytosis.  Most recent CBC (04/08/2022) shows WBC 16.8/ANC 9.5 with basophil 0.8 and eosinophils 3.5; platelets 432.  Review of available records (dating back to November 2022) shows chronic leukocytosis (ranging from WBC 15.3-18.0) and thrombocytosis (platelets ranging 417-522).  Hemoglobin overall normal.  Differential shows persistent neutrophilia, basophilia, and eosinophilia.   He is not on any chronic steroid medication, denies any recent systemic steroids.  He reports recent right eye infection, but denies any recurrent or frequent infections.  He is a former smoker, quit 20+ years ago.  He denies any history of connective tissue disorder or chronic inflammatory disease.  He reports lifelong sinus issues and allergies throughout the year.  He denies any B symptoms.  No abdominal pain or early satiety.  Denies any nausea, vomiting, or diarrhea.  He has pain from arthritis.  No rashes.   He denies any changes in his bowel/bladder habits, breathing pattern, or neurologic status.  He denies any masses or lymphadenopathy.  Patient reports appetite at 90% and energy level at 90%. He is maintaining stable weight at this time.  PMH otherwise notable for hypertension, right eye blindness, hyperlipidemia, essential tremor, and aortic atherosclerosis.  Patient lives at home with his wife and step grandson.  He ambulates with assistance of a cane.  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  He is a former smoker, quit smoking 20+ years ago after smoking 1.5 PPD  cigarettes for about 30 years.  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  He denies any illicit drug use.  Family history significant for patient's brother who has CALR essential thrombocytosis.  No other family history of cancer or blood disorders.    MEDICAL HISTORY:  Past Medical History:  Diagnosis Date   Blind right eye    Scoliosis     SURGICAL HISTORY: Past Surgical History:  Procedure Laterality Date   EYE SURGERY     right eye removed    SOCIAL HISTORY: Social History   Socioeconomic History   Marital status: Married    Spouse name: Not on file   Number of children: Not on file   Years of education: Not on file   Highest education level: Not on file  Occupational History   Not on file  Tobacco Use   Smoking status: Former   Smokeless tobacco: Never  Substance and Sexual Activity   Alcohol use: No   Drug use: No   Sexual activity: Not on file  Other Topics Concern   Not on file  Social History Narrative   Not on file   Social Determinants of Health   Financial Resource Strain: Not on file  Food Insecurity: Not on file  Transportation Needs: Not on file  Physical Activity: Not on file  Stress: Not on file  Social Connections: Not on file  Intimate Partner Violence: Not on file    FAMILY HISTORY: No family history on file.  ALLERGIES:  is allergic to demerol [meperidine].  MEDICATIONS:  Current Outpatient Medications  Medication Sig Dispense Refill   acetaminophen (TYLENOL) 650 MG CR tablet Take 650 mg by mouth as needed for pain.     aspirin EC 81 MG tablet Take 1 tablet (81 mg total) by mouth daily. Swallow whole. 30 tablet 12   atorvastatin (LIPITOR) 10 MG tablet Take 1 tablet (10 mg total) by mouth at bedtime. 90 tablet 1   propranolol (INDERAL) 20 MG tablet Take 1 tablet (20 mg total) by mouth 2 (two) times daily. 180 tablet 3   No current facility-administered medications for this  visit.    REVIEW OF SYSTEMS:   Review of Systems  Constitutional:  Negative for appetite change, chills, diaphoresis, fatigue, fever and unexpected weight change.  HENT:   Negative for lump/mass and nosebleeds.   Eyes:  Negative for eye problems.  Respiratory:  Negative for cough, hemoptysis and shortness of breath.   Cardiovascular:  Positive for palpitations. Negative for chest pain and leg swelling.  Gastrointestinal:  Negative for abdominal pain, blood in stool, constipation, diarrhea, nausea and vomiting.  Genitourinary:  Negative for hematuria.   Skin: Negative.   Neurological:  Negative for dizziness, headaches and light-headedness.  Hematological:  Does not bruise/bleed easily.      PHYSICAL EXAMINATION: ECOG PERFORMANCE STATUS: 0 - Asymptomatic  There were no vitals filed for this visit. There were no vitals filed for this visit.  Physical Exam Constitutional:      Appearance: Normal appearance.  HENT:     Head: Normocephalic and atraumatic.     Mouth/Throat:     Mouth: Mucous membranes are moist.  Eyes:     Extraocular Movements: Extraocular movements intact.     Pupils: Pupils are equal, round, and reactive to light.     Comments: Right eye absent  Cardiovascular:     Rate and Rhythm: Normal rate and regular rhythm.     Pulses: Normal pulses.     Heart sounds: Normal heart sounds.  Pulmonary:     Effort: Pulmonary effort is normal.     Breath sounds: Normal breath sounds. Decreased air movement present.  Abdominal:     General: Bowel sounds are normal.     Palpations: Abdomen is soft.     Tenderness: There is no abdominal tenderness.  Musculoskeletal:        General: No swelling.     Right lower leg: No edema.     Left lower leg: No edema.  Lymphadenopathy:     Cervical: No cervical adenopathy.  Skin:    General: Skin is warm and dry.  Neurological:     General: No focal deficit present.     Mental Status: He is alert and oriented to person, place,  and time.  Psychiatric:        Mood and Affect: Mood normal.        Behavior: Behavior normal.       LABORATORY DATA:  I have reviewed the data as listed Recent Results (from the past 2160 hour(s))  CBC with Differential/Platelet     Status: Abnormal   Collection Time: 04/08/22  9:44 AM  Result Value Ref Range   WBC 16.8 (H) 3.4 - 10.8 x10E3/uL    Comment: White count and platelets may be decreased due to fibrin strands in the sample submitted.    RBC 5.55 4.14 - 5.80 x10E6/uL    Comment: Polychromasia present   Hemoglobin 13.9 13.0 - 17.7 g/dL   Hematocrit 45.0 37.5 - 51.0 %   MCV 81 79 -  97 fL   MCH 25.0 (L) 26.6 - 33.0 pg   MCHC 30.9 (L) 31.5 - 35.7 g/dL   RDW 30.4 (H) 11.6 - 15.4 %   Platelets 432 150 - 450 x10E3/uL   Neutrophils 56 Not Estab. %   Lymphs 15 Not Estab. %   Monocytes 2 Not Estab. %   Eos 21 Not Estab. %   Basos 5 Not Estab. %   Neutrophils Absolute 9.5 (H) 1.4 - 7.0 x10E3/uL   Lymphocytes Absolute 2.6 0.7 - 3.1 x10E3/uL   Monocytes Absolute 0.3 0.1 - 0.9 x10E3/uL   EOS (ABSOLUTE) 3.5 (H) 0.0 - 0.4 x10E3/uL   Basophils Absolute 0.8 (H) 0.0 - 0.2 x10E3/uL   Immature Granulocytes 1 Not Estab. %   Immature Grans (Abs) 0.2 (H) 0.0 - 0.1 x10E3/uL    Comment: (An elevated percentage of Immature Granulocytes has not been found to be clinically significant as a sole clinical predictor of disease. Does NOT include bands or blast cells.  Pregnancy associated physiological leukocytosis may also show increased immature granulocytes without clinical significance.)    NRBC 1 (H) 0 - 0 %   Hematology Comments: Note:     Comment: Verified by microscopic examination.  CMP14+EGFR     Status: Abnormal   Collection Time: 04/08/22  9:44 AM  Result Value Ref Range   Glucose 91 70 - 99 mg/dL   BUN 12 8 - 27 mg/dL   Creatinine, Ser 1.09 0.76 - 1.27 mg/dL   eGFR 71 >59 mL/min/1.73   BUN/Creatinine Ratio 11 10 - 24   Sodium 141 134 - 144 mmol/L   Potassium 5.8 (H) 3.5  - 5.2 mmol/L   Chloride 101 96 - 106 mmol/L   CO2 26 20 - 29 mmol/L   Calcium 9.8 8.6 - 10.2 mg/dL   Total Protein 6.9 6.0 - 8.5 g/dL   Albumin 4.8 3.8 - 4.8 g/dL   Globulin, Total 2.1 1.5 - 4.5 g/dL   Albumin/Globulin Ratio 2.3 (H) 1.2 - 2.2   Bilirubin Total 1.7 (H) 0.0 - 1.2 mg/dL   Alkaline Phosphatase 63 44 - 121 IU/L   AST 27 0 - 40 IU/L   ALT 27 0 - 44 IU/L  Lipid panel     Status: Abnormal   Collection Time: 04/08/22  9:44 AM  Result Value Ref Range   Cholesterol, Total 117 100 - 199 mg/dL   Triglycerides 105 0 - 149 mg/dL   HDL 35 (L) >39 mg/dL   VLDL Cholesterol Cal 20 5 - 40 mg/dL   LDL Chol Calc (NIH) 62 0 - 99 mg/dL   Chol/HDL Ratio 3.3 0.0 - 5.0 ratio    Comment:                                   T. Chol/HDL Ratio                                             Men  Women                               1/2 Avg.Risk  3.4    3.3  Avg.Risk  5.0    4.4                                2X Avg.Risk  9.6    7.1                                3X Avg.Risk 23.4   11.0   Thyroid Panel With TSH     Status: None   Collection Time: 04/08/22  9:44 AM  Result Value Ref Range   TSH 1.540 0.450 - 4.500 uIU/mL   T4, Total 7.7 4.5 - 12.0 ug/dL   T3 Uptake Ratio 25 24 - 39 %   Free Thyroxine Index 1.9 1.2 - 4.9  Basic Metabolic Panel     Status: Abnormal   Collection Time: 04/28/22  9:20 AM  Result Value Ref Range   Glucose 95 70 - 99 mg/dL   BUN 8 8 - 27 mg/dL   Creatinine, Ser 1.17 0.76 - 1.27 mg/dL   eGFR 65 >59 mL/min/1.73   BUN/Creatinine Ratio 7 (L) 10 - 24   Sodium 142 134 - 144 mmol/L   Potassium 5.5 (H) 3.5 - 5.2 mmol/L   Chloride 102 96 - 106 mmol/L   CO2 25 20 - 29 mmol/L   Calcium 9.3 8.6 - 10.2 mg/dL    RADIOGRAPHIC STUDIES: I have personally reviewed the radiological images as listed and agreed with the findings in the report. No results found.  ASSESSMENT & PLAN: 1.  Leukocytosis - Seen at the request of his PCP (FNP Darla Lesches) for evaluation of leukocytosis. - Review of available records (dating back to November 2022) shows chronic leukocytosis (ranging from WBC 15.3-18.0) and thrombocytosis (platelets ranging 417-522).  Hemoglobin overall normal.  Differential shows persistent neutrophilia, basophilia, and eosinophilia.  - Most recent CBC (04/08/2022) shows WBC 16.8/ANC 9.5 with basophil 0.8 and eosinophils 3.5; platelets 432. - No steroids.  Recent eye infection, but no frequent or recurrent infections. - Former smoker (quit 20+ years ago) - No history of connective tissue disorder or autoimmune disease - Patient denies any B symptoms, abdominal pain, or early satiety. - Denies any masses or lymphadenopathy. - No lymphadenopathy or splenomegaly palpable on exam. - PLAN: CBC trend concerning for possible CML.  We will check CBC/D, LDH, BCR/ABL FISH and PCR, and JAK2 with reflex. - If lab work indicative of CML, will discuss with patient and likely send for bone marrow biopsy.    2.  Other history - PMH: Hypertension, right eye blindness, hyperlipidemia, essential tremor, and aortic atherosclerosis. - SOCIAL: Patient lives at home with his wife and step grandson.  He ambulates with assistance of a cane.  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 - SUBSTANCE: He is a former smoker, quit smoking 20+ years ago after smoking 1.5 PPD cigarettes for about 30 years.  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  He denies any illicit drug use. - FAMILY: Brother with CALR essential thrombocytosis.  No other family history of cancer or blood disorders.   All questions were answered. The patient knows to call the clinic with any problems, questions or concerns.   Medical decision making: Moderate  Time spent on visit: I spent 35 minutes counseling the patient face to face. The total time  spent in the appointment was 50 minutes and more than 50% was on  counseling.  I, Tarri Abernethy PA-C, have seen this patient in conjunction with Dr. Derek Jack.  Greater than 50% of visit was performed by Dr. Delton Coombes.   Harriett Rush, PA-C 05/06/2022 6:08 PM   DR. Demetrias Goodbar: I have independently evaluated this patient and formulated my assessment and plan.  I agree with HPI and assessment and plan written by Casey Burkitt, PA-C.  Patient seen at the request of Darla Lesches, FNP for evaluation of leukocytosis with left shift and basophilia.  He has no B symptoms.  Brother has CALR ET.  Will check for myeloproliferative disorders including BCR/ABL and JAK2 V617F mutations.  He also had thrombocytosis intermittently previously.  Will consider further workup with bone marrow aspiration biopsy if the above tests are nonconclusive.

## 2022-05-09 LAB — BCR-ABL1 FISH
Cells Analyzed: 200
Cells Counted: 200

## 2022-05-13 LAB — JAK2 V617F RFX CALR/MPL/E12-15

## 2022-05-13 LAB — CALR +MPL + E12-E15  (REFLEX)

## 2022-05-13 LAB — BCR-ABL1, CML/ALL, PCR, QUANT: Interpretation (BCRAL):: NEGATIVE

## 2022-05-15 ENCOUNTER — Other Ambulatory Visit: Payer: Self-pay | Admitting: *Deleted

## 2022-05-15 DIAGNOSIS — I10 Essential (primary) hypertension: Secondary | ICD-10-CM

## 2022-05-15 DIAGNOSIS — I700 Atherosclerosis of aorta: Secondary | ICD-10-CM

## 2022-05-15 DIAGNOSIS — G250 Essential tremor: Secondary | ICD-10-CM

## 2022-05-15 MED ORDER — ATORVASTATIN CALCIUM 10 MG PO TABS: 10.0000 mg | ORAL_TABLET | Freq: Every day | ORAL | 0 refills | Status: DC

## 2022-05-16 ENCOUNTER — Telehealth: Payer: Self-pay | Admitting: Family Medicine

## 2022-05-16 NOTE — Telephone Encounter (Signed)
[facility]- [facility] with [facility] seeing what we need to do. Awaiting return call

## 2022-05-19 NOTE — Telephone Encounter (Signed)
Updated.

## 2022-05-20 ENCOUNTER — Telehealth: Payer: Self-pay | Admitting: Physician Assistant

## 2022-05-20 NOTE — Telephone Encounter (Signed)
Attempted to call patient to discuss lab results and next steps in testing.  No answer on either listed number.

## 2022-05-21 NOTE — Telephone Encounter (Signed)
Called both of patient's phone numbers 3 times today, unable to connect with patient.  Since I will be out of town and unable to be contacted for the next 10 days, I have passed detailed instructions on to my nurse and covering provider who will continue to attempt to reach patient while I am out of town.

## 2022-05-22 ENCOUNTER — Telehealth: Payer: Self-pay

## 2022-05-22 NOTE — Telephone Encounter (Signed)
Message left on VM to call the CC for instructions from the provider.  Telephone number left on voicemail.

## 2022-05-22 NOTE — Telephone Encounter (Signed)
-----  Message from Harriett Rush, [place] sent at 05/21/2022  4:34 PM EST ----- Horris Latino - I called both of this patient's phone numbers twice on Tuesday and 3x on Wednesday but was unable to speak with him.  Can you please call and discuss the following with him?  - The labs we checked did not show why he has such high white blood cells. - The next step in figuring out what is going on with his blood is to schedule him for a bone marrow biopsy.  This is usually done via IR at [facility] so that they can give him a mild sedative during the procedure. - He is currently scheduled for follow-up visit with me on 06/03/2022.  His follow-up visit will need to be RESCHEDULED to be done 1 week after bone marrow biopsy, and should be done with DR. KATRAGADDA, instead of with me.  If the patient has any questions about the bone marrow biopsy, Dr. Delton Coombes said he can speak with him after you do if needed.  If patient has agreed to bone marrow biopsy, please place order for "CT-guided bone marrow aspiration & biopsy," with reason for procedure being "leukocytosis with basophilia and eosinophilia."  Lilia Pro can help place the order if you are not sure which one to check.)

## 2022-05-27 ENCOUNTER — Ambulatory Visit: Payer: Medicare Other

## 2022-06-02 NOTE — Progress Notes (Unsigned)
[facility] [address], [place] [address]   CLINIC:  Medical Oncology/Hematology  PCP:  Sonny Masters, FNP [address] [place] [address] [phone]   REASON FOR VISIT:  Follow-up for leukocytosis and thrombocytosis  PRIOR THERAPY: None  CURRENT THERAPY: Under workup  INTERVAL HISTORY:  Mr. Bredow 77 y.o. male returns for routine follow-up of his leukocytosis and thrombocytosis.  He was seen for initial consultation by Dr. Ellin Saba and Rojelio Brenner PA-C on 05/06/2022 and returns today to discuss results of that initial workup.  At today's visit, he reports feeling ***.  He denies any major changes in his health since his initial visit 1 month ago.  *** He is not on any chronic steroid medication, denies any recent systemic steroids.  He reports recent right eye infection, but denies any other frequent infections.  He is a former smoker, quit 20+ years ago.  He denies any history of connective tissue disorder or chronic inflammatory disease.  He reports lifelong sinus issues and allergies throughout the year.  He denies any B symptoms.  No abdominal pain or early satiety.  Denies any nausea, vomiting, or diarrhea.  He has pain from arthritis.  No rashes.   He denies any changes in his bowel/bladder habits, breathing pattern, or neurologic status.  He denies any masses or lymphadenopathy.  Patient reports appetite at ***% and energy level at ***%. He is maintaining stable weight at this time.   He has ***% energy and ***% appetite. He endorses that he is maintaining a stable weight.   REVIEW OF SYSTEMS: *** Review of Systems - Oncology    PAST MEDICAL/SURGICAL HISTORY:  Past Medical History:  Diagnosis Date   Blind right eye    Scoliosis    Past Surgical History:  Procedure Laterality Date   EYE SURGERY     right eye removed     SOCIAL HISTORY:  Social History   Socioeconomic History   Marital status: Married    Spouse name:  Not on file   Number of children: Not on file   Years of education: Not on file   Highest education level: Not on file  Occupational History   Not on file  Tobacco Use   Smoking status: Former   Smokeless tobacco: Never  Substance and Sexual Activity   Alcohol use: No   Drug use: No   Sexual activity: Not on file  Other Topics Concern   Not on file  Social History Narrative   Not on file   Social Determinants of Health   Financial Resource Strain: Not on file  Food Insecurity: Not on file  Transportation Needs: Not on file  Physical Activity: Not on file  Stress: Not on file  Social Connections: Not on file  Intimate Partner Violence: Not on file    FAMILY HISTORY:  No family history on file.  CURRENT MEDICATIONS:  Outpatient Encounter Medications as of 06/03/2022  Medication Sig   acetaminophen (TYLENOL) 650 MG CR tablet Take 650 mg by mouth as needed for pain.   aspirin EC 81 MG tablet Take 1 tablet (81 mg total) by mouth daily. Swallow whole.   atorvastatin (LIPITOR) 10 MG tablet Take 1 tablet (10 mg total) by mouth at bedtime.   propranolol (INDERAL) 20 MG tablet Take 1 tablet (20 mg total) by mouth 2 (two) times daily.   No facility-administered encounter medications on file as of 06/03/2022.    ALLERGIES:  Allergies  Allergen Reactions   Demerol [Meperidine] Nausea Only     PHYSICAL EXAM:  ECOG PERFORMANCE STATUS: {CHL ONC ECOG VW:[id number]} *** There were no vitals filed for this visit. There were no vitals filed for this visit. Physical Exam   LABORATORY DATA:  I have reviewed the labs as listed.  CBC    Component Value Date/Time   WBC 19.7 (H) 05/06/2022 0952   RBC 5.65 05/06/2022 0952   HGB 14.1 05/06/2022 0952   HGB 13.9 04/08/2022 0944   HCT 46.9 05/06/2022 0952   HCT 45.0 04/08/2022 0944   PLT 557 (H) 05/06/2022 0952   PLT 432 04/08/2022 0944   MCV 83.0 05/06/2022 0952   MCV 81 04/08/2022 0944   MCH 25.0 (L) 05/06/2022 0952   MCHC  30.1 05/06/2022 0952   RDW 34.2 (H) 05/06/2022 0952   RDW 30.4 (H) 04/08/2022 0944   LYMPHSABS 2.8 05/06/2022 0952   LYMPHSABS 2.6 04/08/2022 0944   MONOABS 0.4 05/06/2022 0952   EOSABS 4.3 (H) 05/06/2022 0952   EOSABS 3.5 (H) 04/08/2022 0944   BASOSABS 0.0 05/06/2022 0952   BASOSABS 0.8 (H) 04/08/2022 0944      Latest Ref Rng & Units 04/28/2022    9:20 AM 04/08/2022    9:44 AM 10/11/2021   10:13 AM  CMP  Glucose 70 - 99 mg/dL 95  91  295   BUN 8 - 27 mg/dL 8  12  11    Creatinine 0.76 - 1.27 mg/dL 6.21  3.08  6.57   Sodium 134 - 144 mmol/L 142  141  138   Potassium 3.5 - 5.2 mmol/L 5.5  5.8  4.9   Chloride 96 - 106 mmol/L 102  101  99   CO2 20 - 29 mmol/L 25  26  26    Calcium 8.6 - 10.2 mg/dL 9.3  9.8  9.1   Total Protein 6.0 - 8.5 g/dL  6.9  6.6   Total Bilirubin 0.0 - 1.2 mg/dL  1.7  1.6   Alkaline Phos 44 - 121 IU/L  63  58   AST 0 - 40 IU/L  27  19   ALT 0 - 44 IU/L  27  10     DIAGNOSTIC IMAGING:  I have independently reviewed the relevant imaging and discussed with the patient.  ASSESSMENT & PLAN: 1.  Leukocytosis - Seen at the request of his PCP (FNP Gilford Silvius) for evaluation of leukocytosis. - Review of available records (dating back to November 2022) shows persistent leukocytosis (ranging from WBC 15.3-18.0) and thrombocytosis (platelets ranging 417-522).  Hemoglobin overall normal.  Differential shows persistent neutrophilia, basophilia, and eosinophilia.  - No steroids.  Recent eye infection, but no frequent or recurrent infections. - Former smoker (quit 20+ years ago) - No history of connective tissue disorder or autoimmune disease - Hematology workup (05/06/2022) was NEGATIVE for BCR/ABL FISH, JAK2 V617F, CALR, MPL, and Exon 12-15.   - Patient denies any B symptoms, abdominal pain, or early satiety.*** - Denies any masses or lymphadenopathy.  No lymphadenopathy or splenomegaly palpable on exam.*** - Most recent CBC (05/06/2022): WBC 19.7, platelets 557, nRBC  0.4%, ANC 12.2, eosinophils 4.3, and smudge cells noted on WBC morphology.  LDH mildly elevated 240. - PLAN: All the lab trend was possibly concerning for CML, workup thus far has been negative.  Recommend BONE MARROW BIOPSY.  Office visit with DR. KATRAGADDA 1 week after biopsy.  ***   2.  Other history - PMH: Hypertension, right eye  blindness, hyperlipidemia, essential tremor, and aortic atherosclerosis. - SOCIAL: Patient lives at home with his wife and step grandson.  He ambulates with assistance of a cane.  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 - SUBSTANCE: He is a former smoker, quit smoking 20+ years ago after smoking 1.5 PPD cigarettes for about 30 years.  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  He denies any illicit drug use. - FAMILY: Brother with CALR essential thrombocytosis.  No other family history of cancer or blood disorders.   PLAN SUMMARY: >> CT-guided bone marrow biopsy at [facility] *** >> MD office visit with DR. KATRAGADDA 1 week after bone marrow biopsy *** >> ***   All questions were answered. The patient knows to call the clinic with any problems, questions or concerns.  Medical decision making: ***  Time spent on visit: I spent *** minutes counseling the patient face to face. The total time spent in the appointment was *** minutes and more than 50% was on counseling.   Carnella Guadalajara, PA-C  ***

## 2022-06-03 ENCOUNTER — Inpatient Hospital Stay: Payer: Medicare Other | Attending: Physician Assistant | Admitting: Physician Assistant

## 2022-07-17 ENCOUNTER — Telehealth: Payer: Self-pay | Admitting: Family Medicine

## 2022-07-17 NOTE — Telephone Encounter (Signed)
Chart updated

## 2022-07-29 NOTE — Progress Notes (Unsigned)
Subjective:   Frank Haas is a 77 y.o. male who presents for an Initial Medicare Annual Wellness Visit.  Review of Systems    ***       Objective:    There were no vitals filed for this visit. There is no height or weight on file to calculate BMI.     05/06/2022    8:23 AM 06/05/2016   12:04 PM  Advanced Directives  Does Patient Have a Medical Advance Directive? No No  Would patient like information on creating a medical advance directive? No - Patient declined     Current Medications (verified) Outpatient Encounter Medications as of 07/30/2022  Medication Sig   acetaminophen (TYLENOL) 650 MG CR tablet Take 650 mg by mouth as needed for pain.   aspirin EC 81 MG tablet Take 1 tablet (81 mg total) by mouth daily. Swallow whole.   atorvastatin (LIPITOR) 10 MG tablet Take 1 tablet (10 mg total) by mouth at bedtime.   propranolol (INDERAL) 20 MG tablet Take 1 tablet (20 mg total) by mouth 2 (two) times daily.   No facility-administered encounter medications on file as of 07/30/2022.    Allergies (verified) Demerol [meperidine]   History: Past Medical History:  Diagnosis Date   Blind right eye    Scoliosis    Past Surgical History:  Procedure Laterality Date   EYE SURGERY     right eye removed   No family history on file. Social History   Socioeconomic History   Marital status: Married    Spouse name: Not on file   Number of children: Not on file   Years of education: Not on file   Highest education level: Not on file  Occupational History   Not on file  Tobacco Use   Smoking status: Former   Smokeless tobacco: Never  Substance and Sexual Activity   Alcohol use: No   Drug use: No   Sexual activity: Not on file  Other Topics Concern   Not on file  Social History Narrative   Not on file   Social Determinants of Health   Financial Resource Strain: Not on file  Food Insecurity: Not on file  Transportation Needs: Not on file  Physical Activity: Not on  file  Stress: Not on file  Social Connections: Not on file    Tobacco Counseling Counseling given: Not Answered   Clinical Intake:                 Diabetic?No          Activities of Daily Living     No data to display          Patient Care Team: Rakes, Connye Burkitt, FNP as PCP - General (Family Medicine)  Indicate any recent Medical Services you may have received from other than [facility] providers in the past year (date may be approximate).     Assessment:   This is a routine wellness examination for [place].  Hearing/Vision screen No results found.  Dietary issues and exercise activities discussed:     Goals Addressed   None    Depression Screen    04/08/2022    9:27 AM 12/04/2021   11:31 AM 10/18/2021    1:05 PM 10/08/2021    4:08 PM 05/28/2021    9:38 AM 05/22/2021    9:27 AM 05/14/2021   10:49 AM  PHQ 2/9 Scores  PHQ - 2 Score 0 '2 1 1 '$ 0 0 0  PHQ- 9  Score 0 '6 7 6 '$ 0 0 0    Fall Risk    04/08/2022    9:27 AM 12/04/2021   11:36 AM 10/08/2021    4:07 PM 05/28/2021    9:39 AM 05/22/2021    9:27 AM  Fall Risk   Falls in the past year? 0 0 1 0 0  Number falls in past yr:   0    Injury with Fall?   0    Risk for fall due to :   History of fall(s)    Follow up   Falls evaluation completed      [place]:  Any stairs in or around the home? {YES/NO:21197} If so, are there any without handrails? {YES/NO:21197} Home free of loose throw rugs in walkways, pet beds, electrical cords, etc? {YES/NO:21197} Adequate lighting in your home to reduce risk of falls? {YES/NO:21197}  ASSISTIVE DEVICES UTILIZED TO PREVENT FALLS:  Life alert? {YES/NO:21197} Use of a cane, walker or w/c? {YES/NO:21197} Grab bars in the bathroom? {YES/NO:21197} Shower chair or bench in shower? {YES/NO:21197} Elevated toilet seat or a handicapped toilet? {YES/NO:21197}  TIMED UP AND GO:  Was the test performed? No . Telephonic visit   Cognitive  Function:        Immunizations Immunization History  Administered Date(s) Administered   Fluad Quad(high Dose 65+) 04/02/2021, 04/08/2022   PFIZER(Purple Top)SARS-COV-2 Vaccination 08/25/2019, 09/17/2019, 05/31/2020   Zoster Recombinat (Shingrix) 10/08/2021, 12/23/2021    {TDAP status:2101805}  Flu Vaccine status: Up to date  {Pneumococcal vaccine status:2101807}  Covid-19 vaccine status: Information provided on how to obtain vaccines.   Qualifies for Shingles Vaccine? Yes   Zostavax completed No   Shingrix Completed?: Yes  Screening Tests Health Maintenance  Topic Date Due   Medicare Annual Wellness (AWV)  Never done   DTaP/Tdap/Td (1 - Tdap) Never done   COVID-19 Vaccine (4 - 2023-24 season) 01/31/2022   Pneumonia Vaccine 47+ Years old (1 of 1 - PCV) 04/09/2023 (Originally 10/15/2012)   COLONOSCOPY (Pts 45-33yr Insurance coverage will need to be confirmed)  04/09/2023 (Originally 10/15/1992)   Hepatitis C Screening  04/09/2023 (Originally 10/15/1963)   INFLUENZA VACCINE  Completed   Zoster Vaccines- Shingrix  Completed   HPV VACCINES  Aged Out    Health Maintenance  Health Maintenance Due  Topic Date Due   Medicare Annual Wellness (AWV)  Never done   DTaP/Tdap/Td (1 - Tdap) Never done   COVID-19 Vaccine (4 - 2023-24 season) 01/31/2022    {Colorectal cancer screening:2101809}  Lung Cancer Screening: (Low Dose CT Chest recommended if Age 75-80years, 30 pack-year currently smoking OR have quit w/in 15years.) does not qualify.   Lung Cancer Screening Referral: n/a  Additional Screening:  Hepatitis C Screening: does qualify; Completed at next office visit   Vision Screening: Recommended annual ophthalmology exams for early detection of glaucoma and other disorders of the eye. Is the patient up to date with their annual eye exam?  {YES/NO:21197} Who is the provider or what is the name of the office in which the patient attends annual eye exams? *** If pt is not  established with a provider, would they like to be referred to a provider to establish care? {YES/NO:21197}.   Dental Screening: Recommended annual dental exams for proper oral hygiene  Community Resource Referral / Chronic Care Management: CRR required this visit?  {YES/NO:21197}  CCM required this visit?  {YES/NO:21197}     Plan:     I  have personally reviewed and noted the following in the patient's chart:   Medical and social history Use of alcohol, tobacco or illicit drugs  Current medications and supplements including opioid prescriptions. {Opioid Prescriptions:[phone]} Functional ability and status Nutritional status Physical activity Advanced directives List of other physicians Hospitalizations, surgeries, and ER visits in previous 12 months Vitals Screenings to include cognitive, depression, and falls Referrals and appointments  In addition, I have reviewed and discussed with patient certain preventive protocols, quality metrics, and best practice recommendations. A written personalized care plan for preventive services as well as general preventive health recommendations were provided to patient.     Vanetta Mulders, [place]   [id number]   Due to this being a virtual visit, the after visit summary with patients personalized plan was offered to patient via mail or my-chart. ***Patient declined at this time./ Patient would like to access on my-chart/ per request, patient was mailed a copy of AVS./ Patient preferred to pick up at office at next visit   Nurse Notes: ***

## 2022-07-29 NOTE — Patient Instructions (Signed)
Frank Haas , Thank you for taking time to come for your Medicare Wellness Visit. I appreciate your ongoing commitment to your health goals. Please review the following plan we discussed and let me know if I can assist you in the future.   These are the goals we discussed:  Goals   None     This is a list of the screening recommended for you and due dates:  Health Maintenance  Topic Date Due   Medicare Annual Wellness Visit  Never done   DTaP/Tdap/Td vaccine (1 - Tdap) Never done   COVID-19 Vaccine (4 - 2023-24 season) 01/31/2022   Pneumonia Vaccine (1 of 1 - PCV) 04/09/2023*   Colon Cancer Screening  04/09/2023*   Hepatitis C Screening: USPSTF Recommendation to screen - Ages 18-79 yo.  04/09/2023*   Flu Shot  Completed   Zoster (Shingles) Vaccine  Completed   HPV Vaccine  Aged Out  *Topic was postponed. The date shown is not the original due date.    Advanced directives: ***  Conditions/risks identified: Aim for 30 minutes of exercise or brisk walking, 6-8 glasses of water, and 5 servings of fruits and vegetables each day.   Next appointment: Follow up in one year for your annual wellness visit.   Preventive Care 75 Years and Older, Male  Preventive care refers to lifestyle choices and visits with your health care provider that can promote health and wellness. What does preventive care include? A yearly physical exam. This is also called an annual well check. Dental exams once or twice a year. Routine eye exams. Ask your health care provider how often you should have your eyes checked. Personal lifestyle choices, including: Daily care of your teeth and gums. Regular physical activity. Eating a healthy diet. Avoiding tobacco and drug use. Limiting alcohol use. Practicing safe sex. Taking low doses of aspirin every day. Taking vitamin and mineral supplements as recommended by your health care provider. What happens during an annual well check? The services and screenings  done by your health care provider during your annual well check will depend on your age, overall health, lifestyle risk factors, and family history of disease. Counseling  Your health care provider may ask you questions about your: Alcohol use. Tobacco use. Drug use. Emotional well-being. Home and relationship well-being. Sexual activity. Eating habits. History of falls. Memory and ability to understand (cognition). Work and work Statistician. Screening  You may have the following tests or measurements: Height, weight, and BMI. Blood pressure. Lipid and cholesterol levels. These may be checked every 5 years, or more frequently if you are over 8 years old. Skin check. Lung cancer screening. You may have this screening every year starting at age 75 if you have a 30-pack-year history of smoking and currently smoke or have quit within the past 15 years. Fecal occult blood test (FOBT) of the stool. You may have this test every year starting at age 75. Flexible sigmoidoscopy or colonoscopy. You may have a sigmoidoscopy every 5 years or a colonoscopy every 10 years starting at age 75. Prostate cancer screening. Recommendations will vary depending on your family history and other risks. Hepatitis C blood test. Hepatitis B blood test. Sexually transmitted disease (STD) testing. Diabetes screening. This is done by checking your blood sugar (glucose) after you have not eaten for a while (fasting). You may have this done every 1-3 years. Abdominal aortic aneurysm (AAA) screening. You may need this if you are a current or former smoker. Osteoporosis. You  may be screened starting at age 75 if you are at high risk. Talk with your health care provider about your test results, treatment options, and if necessary, the need for more tests. Vaccines  Your health care provider may recommend certain vaccines, such as: Influenza vaccine. This is recommended every year. Tetanus, diphtheria, and acellular  pertussis (Tdap, Td) vaccine. You may need a Td booster every 10 years. Zoster vaccine. You may need this after age 57. Pneumococcal 13-valent conjugate (PCV13) vaccine. One dose is recommended after age 75. Pneumococcal polysaccharide (PPSV23) vaccine. One dose is recommended after age 75. Talk to your health care provider about which screenings and vaccines you need and how often you need them. This information is not intended to replace advice given to you by your health care provider. Make sure you discuss any questions you have with your health care provider. Document Released: 06/15/2015 Document Revised: 02/06/2016 Document Reviewed: 03/20/2015 Elsevier Interactive Patient Education  2017 [place] Prevention in the Home Falls can cause injuries. They can happen to people of all ages. There are many things you can do to make your home safe and to help prevent falls. What can I do on the outside of my home? Regularly fix the edges of walkways and driveways and fix any cracks. Remove anything that might make you trip as you walk through a door, such as a raised step or threshold. Trim any bushes or trees on the path to your home. Use bright outdoor lighting. Clear any walking paths of anything that might make someone trip, such as rocks or tools. Regularly check to see if handrails are loose or broken. Make sure that both sides of any steps have handrails. Any raised decks and porches should have guardrails on the edges. Have any leaves, snow, or ice cleared regularly. Use sand or salt on walking paths during winter. Clean up any spills in your garage right away. This includes oil or grease spills. What can I do in the bathroom? Use night lights. Install grab bars by the toilet and in the tub and shower. Do not use towel bars as grab bars. Use non-skid mats or decals in the tub or shower. If you need to sit down in the shower, use a plastic, non-slip stool. Keep the floor  dry. Clean up any water that spills on the floor as soon as it happens. Remove soap buildup in the tub or shower regularly. Attach bath mats securely with double-sided non-slip rug tape. Do not have throw rugs and other things on the floor that can make you trip. What can I do in the bedroom? Use night lights. Make sure that you have a light by your bed that is easy to reach. Do not use any sheets or blankets that are too big for your bed. They should not hang down onto the floor. Have a firm chair that has side arms. You can use this for support while you get dressed. Do not have throw rugs and other things on the floor that can make you trip. What can I do in the kitchen? Clean up any spills right away. Avoid walking on wet floors. Keep items that you use a lot in easy-to-reach places. If you need to reach something above you, use a strong step stool that has a grab bar. Keep electrical cords out of the way. Do not use floor polish or wax that makes floors slippery. If you must use wax, use non-skid floor wax. Do  not have throw rugs and other things on the floor that can make you trip. What can I do with my stairs? Do not leave any items on the stairs. Make sure that there are handrails on both sides of the stairs and use them. Fix handrails that are broken or loose. Make sure that handrails are as long as the stairways. Check any carpeting to make sure that it is firmly attached to the stairs. Fix any carpet that is loose or worn. Avoid having throw rugs at the top or bottom of the stairs. If you do have throw rugs, attach them to the floor with carpet tape. Make sure that you have a light switch at the top of the stairs and the bottom of the stairs. If you do not have them, ask someone to add them for you. What else can I do to help prevent falls? Wear shoes that: Do not have high heels. Have rubber bottoms. Are comfortable and fit you well. Are closed at the toe. Do not wear  sandals. If you use a stepladder: Make sure that it is fully opened. Do not climb a closed stepladder. Make sure that both sides of the stepladder are locked into place. Ask someone to hold it for you, if possible. Clearly mark and make sure that you can see: Any grab bars or handrails. First and last steps. Where the edge of each step is. Use tools that help you move around (mobility aids) if they are needed. These include: Canes. Walkers. Scooters. Crutches. Turn on the lights when you go into a dark area. Replace any light bulbs as soon as they burn out. Set up your furniture so you have a clear path. Avoid moving your furniture around. If any of your floors are uneven, fix them. If there are any pets around you, be aware of where they are. Review your medicines with your doctor. Some medicines can make you feel dizzy. This can increase your chance of falling. Ask your doctor what other things that you can do to help prevent falls. This information is not intended to replace advice given to you by your health care provider. Make sure you discuss any questions you have with your health care provider. Document Released: 03/15/2009 Document Revised: 10/25/2015 Document Reviewed: 06/23/2014 Elsevier Interactive Patient Education  2017 [employer].

## 2022-07-30 ENCOUNTER — Ambulatory Visit (INDEPENDENT_AMBULATORY_CARE_PROVIDER_SITE_OTHER): Payer: 59

## 2022-07-30 VITALS — Ht 73.0 in | Wt 177.0 lb

## 2022-07-30 DIAGNOSIS — Z0000 Encounter for general adult medical examination without abnormal findings: Secondary | ICD-10-CM | POA: Diagnosis not present

## 2022-08-19 ENCOUNTER — Telehealth: Payer: Self-pay | Admitting: Family Medicine

## 2022-08-19 DIAGNOSIS — I700 Atherosclerosis of aorta: Secondary | ICD-10-CM

## 2022-08-19 MED ORDER — ATORVASTATIN CALCIUM 10 MG PO TABS: 10.0000 mg | ORAL_TABLET | Freq: Every day | ORAL | 0 refills | Status: DC

## 2022-08-19 NOTE — Telephone Encounter (Signed)
  Prescription Request  08/19/2022  Is this a "Controlled Substance" medicine? no  Have you seen your PCP in the last 2 weeks? no  If YES, route message to pool  -  If NO, patient needs to be scheduled for appointment.  What is the name of the medication or equipment? Atorvastatin 10 mg   Have you contacted your pharmacy to request a refill? NO   Which pharmacy would you like this sent to? [facility] in [place]   Patient notified that their request is being sent to the clinical staff for review and that they should receive a response within 2 business days.

## 2022-08-19 NOTE — Telephone Encounter (Signed)
REFILL SENT TO PHARMACY  

## 2022-10-07 ENCOUNTER — Ambulatory Visit (INDEPENDENT_AMBULATORY_CARE_PROVIDER_SITE_OTHER): Payer: 59 | Admitting: Family Medicine

## 2022-10-07 ENCOUNTER — Encounter: Payer: Self-pay | Admitting: Family Medicine

## 2022-10-07 VITALS — BP 153/73 | HR 69 | Temp 97.9°F | Ht 73.0 in | Wt 177.6 lb

## 2022-10-07 DIAGNOSIS — R7989 Other specified abnormal findings of blood chemistry: Secondary | ICD-10-CM

## 2022-10-07 DIAGNOSIS — G250 Essential tremor: Secondary | ICD-10-CM | POA: Diagnosis not present

## 2022-10-07 DIAGNOSIS — E875 Hyperkalemia: Secondary | ICD-10-CM

## 2022-10-07 DIAGNOSIS — I700 Atherosclerosis of aorta: Secondary | ICD-10-CM

## 2022-10-07 DIAGNOSIS — I10 Essential (primary) hypertension: Secondary | ICD-10-CM | POA: Diagnosis not present

## 2022-10-07 MED ORDER — LISINOPRIL 5 MG PO TABS: 5.0000 mg | ORAL_TABLET | Freq: Every day | ORAL | 3 refills | Status: DC

## 2022-10-07 NOTE — Progress Notes (Signed)
Subjective:  Patient ID: Frank Haas, male    DOB: [birth year], 77 y.o.   MRN: [id number]  Patient Care Team: Sonny Masters, FNP as PCP - General (Family Medicine)   Chief Complaint:  Medical Management of Chronic Issues (6 month follow up )   HPI: Frank Haas is a 77 y.o. male presenting on 10/07/2022 for Medical Management of Chronic Issues (6 month follow up )    1. Primary hypertension Has been on inderal for dual benefit, blood pressure control and tremor relief. Has been doing well but blood pressure remains elevated. Denies chest pain, leg swelling, weakness, confusion, headaches, or syncope.   2. Essential tremor Doing well with inderal. No new or worsening symptoms. No fatigue from BB therapy.   3. Abnormal CBC Ongoing leukocytosis and thrombocytosis. Has been evaluated by hematology and oncology. Has not scheduled his follow up visit. Aware he needs to schedule an appointment.   4. Atherosclerosis of aorta (HCC) He is on ASA and statin therapy. Denies any anginal symptoms.      Relevant past medical, surgical, family, and social history reviewed and updated as indicated.  Allergies and medications reviewed and updated. Data reviewed: Chart in Epic.   Past Medical History:  Diagnosis Date   Blind right eye    Scoliosis     Past Surgical History:  Procedure Laterality Date   EYE SURGERY     right eye removed    Social History   Socioeconomic History   Marital status: Married    Spouse name: Not on file   Number of children: Not on file   Years of education: Not on file   Highest education level: Not on file  Occupational History   Not on file  Tobacco Use   Smoking status: Former    Types: Cigarettes   Smokeless tobacco: Never  Vaping Use   Vaping Use: Never used  Substance and Sexual Activity   Alcohol use: No   Drug use: No   Sexual activity: Not on file  Other Topics Concern   Not on file  Social History Narrative   Not on file    Social Determinants of Health   Financial Resource Strain: Low Risk  (07/30/2022)   Overall Financial Resource Strain (CARDIA)    Difficulty of Paying Living Expenses: Not hard at all  Food Insecurity: No Food Insecurity (07/30/2022)   Hunger Vital Sign    Worried About Running Out of Food in the Last Year: Never true    Ran Out of Food in the Last Year: Never true  Transportation Needs: No Transportation Needs (07/30/2022)   PRAPARE - Administrator, Civil Service (Medical): No    Lack of Transportation (Non-Medical): No  Physical Activity: Insufficiently Active (07/30/2022)   Exercise Vital Sign    Days of Exercise per Week: 3 days    Minutes of Exercise per Session: 30 min  Stress: No Stress Concern Present (07/30/2022)   [employer] of Occupational Health - Occupational Stress Questionnaire    Feeling of Stress : Not at all  Social Connections: Moderately Integrated (07/30/2022)   Social Connection and Isolation Panel [NHANES]    Frequency of Communication with Friends and Family: More than three times a week    Frequency of Social Gatherings with Friends and Family: Three times a week    Attends Religious Services: 1 to 4 times per year    Active Member of Clubs or Organizations: No  Attends Banker Meetings: Never    Marital Status: Married  Catering manager Violence: Not At Risk (07/30/2022)   Humiliation, Afraid, Rape, and Kick questionnaire    Fear of Current or Ex-Partner: No    Emotionally Abused: No    Physically Abused: No    Sexually Abused: No    Outpatient Encounter Medications as of 10/07/2022  Medication Sig   acetaminophen (TYLENOL) 650 MG CR tablet Take 650 mg by mouth as needed for pain.   aspirin EC 81 MG tablet Take 1 tablet (81 mg total) by mouth daily. Swallow whole.   atorvastatin (LIPITOR) 10 MG tablet Take 1 tablet (10 mg total) by mouth at bedtime.   lisinopril (ZESTRIL) 5 MG tablet Take 1 tablet (5 mg total) by mouth  daily.   loratadine (CLARITIN) 10 MG tablet Take 10 mg by mouth daily.   propranolol (INDERAL) 20 MG tablet Take 1 tablet (20 mg total) by mouth 2 (two) times daily.   No facility-administered encounter medications on file as of 10/07/2022.    Allergies  Allergen Reactions   Demerol [Meperidine] Nausea Only    Review of Systems  Constitutional:  Negative for activity change, appetite change, chills, diaphoresis, fatigue, fever and unexpected weight change.  HENT: Negative.    Eyes: Negative.   Respiratory:  Negative for cough, chest tightness and shortness of breath.   Cardiovascular:  Negative for chest pain, palpitations and leg swelling.  Gastrointestinal:  Negative for abdominal pain, blood in stool, constipation, diarrhea, nausea and vomiting.  Endocrine: Negative.   Genitourinary:  Negative for decreased urine volume, difficulty urinating, dysuria, frequency and urgency.  Musculoskeletal:  Negative for arthralgias and myalgias.  Skin: Negative.   Allergic/Immunologic: Negative.   Neurological:  Negative for dizziness, tremors, seizures, syncope, facial asymmetry, speech difficulty, weakness, light-headedness, numbness and headaches.  Hematological: Negative.   Psychiatric/Behavioral:  Negative for confusion, hallucinations, sleep disturbance and suicidal ideas.   All other systems reviewed and are negative.       Objective:  BP (!) 153/73   Pulse 69   Temp 97.9 F (36.6 C) (Temporal)   Ht 6\' 1"  (1.854 m)   Wt 177 lb 9.6 oz (80.6 kg)   SpO2 98%   BMI 23.43 kg/m    Wt Readings from Last 3 Encounters:  10/07/22 177 lb 9.6 oz (80.6 kg)  07/30/22 177 lb (80.3 kg)  05/06/22 180 lb 12.8 oz (82 kg)    Physical Exam Vitals and nursing note reviewed.  Constitutional:      General: He is not in acute distress.    Appearance: Normal appearance. He is not ill-appearing, toxic-appearing or diaphoretic.  HENT:     Head: Normocephalic and atraumatic.     Nose: Nose normal.      Mouth/Throat:     Mouth: Mucous membranes are moist.  Eyes:     Comments: Missing right eye  Pulmonary:     Effort: Pulmonary effort is normal.     Breath sounds: Normal breath sounds.  Musculoskeletal:     Thoracic back: Scoliosis present.     Right lower leg: No edema.     Left lower leg: No edema.     Comments: Kyphosis  Skin:    General: Skin is warm and dry.     Capillary Refill: Capillary refill takes less than 2 seconds.  Neurological:     General: No focal deficit present.     Mental Status: He is alert and oriented to person,  place, and time.     Gait: Gait abnormal (using cane).  Psychiatric:        Mood and Affect: Mood normal.        Behavior: Behavior normal.        Thought Content: Thought content normal.        Judgment: Judgment normal.     Results for orders placed or performed in visit on 05/06/22  BCR-ABL1, CML/ALL, PCR, QUANT  Result Value Ref Range   b2a2 transcript Comment %   b3a2 transcript Comment %   E1A2 Transcript Comment %   Interpretation (BCRAL): Negative    Director Review [facility]): Comment    Background: Comment    Methodology Comment   JAK2 V617F rfx CALR/MPL/E12-15  Result Value Ref Range   JAK2 V617F Result Comment    Reflex Comment    V617F Rfx CALR/MPL/E12-15 Bkgd Comment    Method based next generation sequencing.    References Comment    Director Review Comment   Lactate dehydrogenase  Result Value Ref Range   LDH 240 (H) 98 - 192 U/L  BCR-ABL1 FISH  Result Value Ref Range   Specimen Type BLOOD    Cells Counted 200    Cells Analyzed 200    FISH Result Comment:    Interpretation Comment:    Director Review: Comment:   CBC with Differential/Platelet  Result Value Ref Range   WBC 19.7 (H) 4.0 - 10.5 K/uL   RBC 5.65 4.22 - 5.81 MIL/uL   Hemoglobin 14.1 13.0 - 17.0 g/dL   HCT 16.1 09.6 - 04.5 %   MCV 83.0 80.0 - 100.0 fL   MCH 25.0 (L) 26.0 - 34.0 pg   MCHC 30.1 30.0 - 36.0 g/dL   RDW 40.9 (H) 81.1 - 91.4 %    Platelets 557 (H) 150 - 400 K/uL   nRBC 0.4 (H) 0.0 - 0.2 %   Neutrophils Relative % 62 %   Neutro Abs 12.2 (H) 1.7 - 7.7 K/uL   Lymphocytes Relative 14 %   Lymphs Abs 2.8 0.7 - 4.0 K/uL   Monocytes Relative 2 %   Monocytes Absolute 0.4 0.1 - 1.0 K/uL   Eosinophils Relative 22 %   Eosinophils Absolute 4.3 (H) 0.0 - 0.5 K/uL   Basophils Relative 0 %   Basophils Absolute 0.0 0.0 - 0.1 K/uL   WBC Morphology SMUDGE CELLS    Abs Immature Granulocytes 0.00 0.00 - 0.07 K/uL   Polychromasia PRESENT    Target Cells PRESENT   CALR +MPL + E12-E15 (reflexed)  Result Value Ref Range   CALR Result Comment    MPL Result Comment    E12-15 Result Comment        Pertinent labs & imaging results that were available during my care of the patient were reviewed by me and considered in my medical decision making.  Assessment & Plan:  Davis was seen today for medical management of chronic issues.  Diagnoses and all orders for this visit:  Primary hypertension BP not controlled. Changes were made in regimen today, lisinopril added. Goal BP is 130/80. Pt aware to report any persistent high or low readings. DASH diet and exercise encouraged. Exercise at least 150 minutes per week and increase as tolerated. Goal BMI > 25. Stress management encouraged. Avoid nicotine and tobacco product use. Avoid excessive alcohol and NSAID's. Avoid more than 2000 mg of sodium daily. Medications as prescribed. Follow up as scheduled.  -     lisinopril (  ZESTRIL) 5 MG tablet; Take 1 tablet (5 mg total) by mouth daily. -     CBC with Differential/Platelet -     CMP14+EGFR -     Lipid panel -     Thyroid Panel With TSH  Essential tremor Doing well with inderal, will continue.  -     CMP14+EGFR -     Thyroid Panel With TSH  Abnormal CBC Followed by hematology and oncology, aware to make follow up.  -     CBC with Differential/Platelet  Atherosclerosis of aorta (HCC) On ASA and statin therapy. Will repeat lipids  today.  -     Lipid panel     Continue all other maintenance medications.  Follow up plan: Return in about 3 months (around 01/07/2023), or if symptoms worsen or fail to improve, for HTN.   Continue healthy lifestyle choices, including diet (rich in fruits, vegetables, and lean proteins, and low in salt and simple carbohydrates) and exercise (at least 30 minutes of moderate physical activity daily).   The above assessment and management plan was discussed with the patient. The patient verbalized understanding of and has agreed to the management plan. Patient is aware to call the clinic if they develop any new symptoms or if symptoms persist or worsen. Patient is aware when to return to the clinic for a follow-up visit. Patient educated on when it is appropriate to go to the emergency department.   Kari Baars, FNP-C [facility] [phone]

## 2022-10-07 NOTE — Patient Instructions (Signed)
Call and make an appointment with Dr. Ellin Saba or Lurena Joiner

## 2022-10-09 ENCOUNTER — Other Ambulatory Visit: Payer: 59

## 2022-10-09 LAB — CBC WITH DIFFERENTIAL/PLATELET
Basophils Absolute: 0.8 10*3/uL — ABNORMAL HIGH (ref 0.0–0.2)
Basos: 5 %
EOS (ABSOLUTE): 2.9 10*3/uL — ABNORMAL HIGH (ref 0.0–0.4)
Eos: 18 %
Hematocrit: 44 % (ref 37.5–51.0)
Hemoglobin: 13.6 g/dL (ref 13.0–17.7)
Immature Grans (Abs): 0.2 10*3/uL — ABNORMAL HIGH (ref 0.0–0.1)
Immature Granulocytes: 1 %
Lymphocytes Absolute: 2.3 10*3/uL (ref 0.7–3.1)
Lymphs: 14 %
MCH: 25.2 pg — ABNORMAL LOW (ref 26.6–33.0)
MCHC: 30.9 g/dL — ABNORMAL LOW (ref 31.5–35.7)
MCV: 82 fL (ref 79–97)
Monocytes Absolute: 0.3 10*3/uL (ref 0.1–0.9)
Monocytes: 2 %
Neutrophils Absolute: 9.6 10*3/uL — ABNORMAL HIGH (ref 1.4–7.0)
Neutrophils: 60 %
Platelets: 405 10*3/uL (ref 150–450)
RBC: 5.4 x10E6/uL (ref 4.14–5.80)
RDW: 30.2 % — ABNORMAL HIGH (ref 11.6–15.4)
WBC: 16 10*3/uL — ABNORMAL HIGH (ref 3.4–10.8)

## 2022-10-09 LAB — CMP14+EGFR
ALT: 16 IU/L (ref 0–44)
AST: 19 IU/L (ref 0–40)
Albumin/Globulin Ratio: 2.1 (ref 1.2–2.2)
Albumin: 4.5 g/dL (ref 3.8–4.8)
Alkaline Phosphatase: 59 IU/L (ref 44–121)
BUN/Creatinine Ratio: 11 (ref 10–24)
BUN: 13 mg/dL (ref 8–27)
Bilirubin Total: 1.5 mg/dL — ABNORMAL HIGH (ref 0.0–1.2)
CO2: 20 mmol/L (ref 20–29)
Calcium: 9.7 mg/dL (ref 8.6–10.2)
Chloride: 102 mmol/L (ref 96–106)
Creatinine, Ser: 1.15 mg/dL (ref 0.76–1.27)
Globulin, Total: 2.1 g/dL (ref 1.5–4.5)
Glucose: 102 mg/dL — ABNORMAL HIGH (ref 70–99)
Potassium: 5.5 mmol/L — ABNORMAL HIGH (ref 3.5–5.2)
Sodium: 141 mmol/L (ref 134–144)
Total Protein: 6.6 g/dL (ref 6.0–8.5)
eGFR: 67 mL/min/{1.73_m2} (ref >59)

## 2022-10-09 LAB — LIPID PANEL
Chol/HDL Ratio: 2.6 ratio (ref 0.0–5.0)
Cholesterol, Total: 102 mg/dL (ref 100–199)
HDL: 39 mg/dL — ABNORMAL LOW (ref >39)
LDL Chol Calc (NIH): 48 mg/dL (ref 0–99)
Triglycerides: 74 mg/dL (ref 0–149)
VLDL Cholesterol Cal: 15 mg/dL (ref 5–40)

## 2022-10-09 LAB — THYROID PANEL WITH TSH
Free Thyroxine Index: 2.1 (ref 1.2–4.9)
T3 Uptake Ratio: 25 % (ref 24–39)
T4, Total: 8.3 ug/dL (ref 4.5–12.0)
TSH: 1.66 u[IU]/mL (ref 0.450–4.500)

## 2022-10-09 NOTE — Addendum Note (Signed)
Addended by: Lorelee Cover C on: 10/09/2022 04:14 PM   Modules accepted: Orders

## 2022-10-10 ENCOUNTER — Other Ambulatory Visit: Payer: 59

## 2022-10-13 ENCOUNTER — Other Ambulatory Visit: Payer: 59

## 2022-10-13 DIAGNOSIS — E875 Hyperkalemia: Secondary | ICD-10-CM

## 2022-10-14 LAB — CMP14+EGFR
ALT: 13 IU/L (ref 0–44)
AST: 23 IU/L (ref 0–40)
Albumin/Globulin Ratio: 2.1 (ref 1.2–2.2)
Albumin: 4.5 g/dL (ref 3.8–4.8)
Alkaline Phosphatase: 76 IU/L (ref 44–121)
BUN/Creatinine Ratio: 14 (ref 10–24)
BUN: 13 mg/dL (ref 8–27)
Bilirubin Total: 1.1 mg/dL (ref 0.0–1.2)
CO2: 23 mmol/L (ref 20–29)
Calcium: 8.9 mg/dL (ref 8.6–10.2)
Chloride: 104 mmol/L (ref 96–106)
Creatinine, Ser: 0.96 mg/dL (ref 0.76–1.27)
Globulin, Total: 2.1 g/dL (ref 1.5–4.5)
Glucose: 98 mg/dL (ref 70–99)
Potassium: 5.2 mmol/L (ref 3.5–5.2)
Sodium: 140 mmol/L (ref 134–144)
Total Protein: 6.6 g/dL (ref 6.0–8.5)
eGFR: 83 mL/min/{1.73_m2} (ref >59)

## 2022-11-12 ENCOUNTER — Telehealth: Payer: Self-pay | Admitting: Family Medicine

## 2022-11-12 DIAGNOSIS — I700 Atherosclerosis of aorta: Secondary | ICD-10-CM

## 2022-11-12 MED ORDER — ATORVASTATIN CALCIUM 10 MG PO TABS
10.0000 mg | ORAL_TABLET | Freq: Every day | ORAL | 0 refills | Status: DC
Start: 2022-11-12 — End: 2023-01-21

## 2022-11-12 NOTE — Telephone Encounter (Signed)
  Prescription Request  11/12/2022  Is this a "Controlled Substance" medicine? no  Have you seen your PCP in the last 2 weeks? LOV 10/07/22 NOV 01/08/23  If YES, route message to pool  -  If NO, patient needs to be scheduled for appointment.  What is the name of the medication or equipment? atorvastatin (LIPITOR) 10 MG tablet  Have you contacted your pharmacy to request a refill? yes   Which pharmacy would you like this sent to?  [id number] - [place], [place] - [address]      Patient notified that their request is being sent to the clinical staff for review and that they should receive a response within 2 business days.

## 2023-01-08 ENCOUNTER — Ambulatory Visit: Payer: 59 | Admitting: Family Medicine

## 2023-01-21 ENCOUNTER — Encounter: Payer: Self-pay | Admitting: Family Medicine

## 2023-01-21 ENCOUNTER — Ambulatory Visit (INDEPENDENT_AMBULATORY_CARE_PROVIDER_SITE_OTHER): Payer: 59 | Admitting: Family Medicine

## 2023-01-21 VITALS — BP 131/73 | HR 88 | Temp 99.1°F | Ht 73.0 in | Wt 173.6 lb

## 2023-01-21 DIAGNOSIS — I700 Atherosclerosis of aorta: Secondary | ICD-10-CM

## 2023-01-21 DIAGNOSIS — I10 Essential (primary) hypertension: Secondary | ICD-10-CM | POA: Diagnosis not present

## 2023-01-21 DIAGNOSIS — G250 Essential tremor: Secondary | ICD-10-CM

## 2023-01-21 LAB — BMP8+EGFR
BUN/Creatinine Ratio: 15 (ref 10–24)
BUN: 18 mg/dL (ref 8–27)
CO2: 24 mmol/L (ref 20–29)
Calcium: 9.3 mg/dL (ref 8.6–10.2)
Chloride: 101 mmol/L (ref 96–106)
Creatinine, Ser: 1.24 mg/dL (ref 0.76–1.27)
Glucose: 104 mg/dL — ABNORMAL HIGH (ref 70–99)
Potassium: 5 mmol/L (ref 3.5–5.2)
Sodium: 140 mmol/L (ref 134–144)
eGFR: 61 mL/min/{1.73_m2} (ref >59)

## 2023-01-21 MED ORDER — ATORVASTATIN CALCIUM 10 MG PO TABS: 10.0000 mg | ORAL_TABLET | Freq: Every day | ORAL | 1 refills | Status: DC

## 2023-01-21 NOTE — Patient Instructions (Signed)

## 2023-01-21 NOTE — Progress Notes (Signed)
Subjective:  Patient ID: Frank Haas, male    DOB: Feb 08, 1946, 77 y.o.   MRN: [id number]  Patient Care Team: Sonny Masters, FNP as PCP - General (Family Medicine)   Chief Complaint:  Hypertension (3 month follow up )   HPI: Frank Haas is a 77 y.o. male presenting on 01/21/2023 for Hypertension (3 month follow up )    1. Atherosclerosis of aorta (HCC) He is taking his ASA and statin therapy as prescribed and tolerating them well. Denies any anginal symptoms.   2. Primary hypertension Doing well on lisinopril, states BP is very well controlled. Denies any side effects. No chest pain, leg swelling, palpitations, shortness of breath, cough, headaches, confusion, or syncope.   3. Essential tremor Tolerating inderal well. Sates he still has tremors at times but not as significant as they were in the past.      Relevant past medical, surgical, family, and social history reviewed and updated as indicated.  Allergies and medications reviewed and updated. Data reviewed: Chart in Epic.   Past Medical History:  Diagnosis Date   Blind right eye    Scoliosis     Past Surgical History:  Procedure Laterality Date   EYE SURGERY     right eye removed    Social History   Socioeconomic History   Marital status: Married    Spouse name: Not on file   Number of children: Not on file   Years of education: Not on file   Highest education level: Not on file  Occupational History   Not on file  Tobacco Use   Smoking status: Former    Types: Cigarettes   Smokeless tobacco: Never  Vaping Use   Vaping status: Never Used  Substance and Sexual Activity   Alcohol use: No   Drug use: No   Sexual activity: Not on file  Other Topics Concern   Not on file  Social History Narrative   Not on file   Social Determinants of Health   Financial Resource Strain: Low Risk  (07/30/2022)   Overall Financial Resource Strain (CARDIA)    Difficulty of Paying Living Expenses: Not hard at  all  Food Insecurity: No Food Insecurity (07/30/2022)   Hunger Vital Sign    Worried About Running Out of Food in the Last Year: Never true    Ran Out of Food in the Last Year: Never true  Transportation Needs: No Transportation Needs (07/30/2022)   PRAPARE - Administrator, Civil Service (Medical): No    Lack of Transportation (Non-Medical): No  Physical Activity: Insufficiently Active (07/30/2022)   Exercise Vital Sign    Days of Exercise per Week: 3 days    Minutes of Exercise per Session: 30 min  Stress: No Stress Concern Present (07/30/2022)   [employer] of Occupational Health - Occupational Stress Questionnaire    Feeling of Stress : Not at all  Social Connections: Moderately Integrated (07/30/2022)   Social Connection and Isolation Panel [NHANES]    Frequency of Communication with Friends and Family: More than three times a week    Frequency of Social Gatherings with Friends and Family: Three times a week    Attends Religious Services: 1 to 4 times per year    Active Member of Clubs or Organizations: No    Attends Banker Meetings: Never    Marital Status: Married  Catering manager Violence: Not At Risk (07/30/2022)   Humiliation, Afraid, Rape, and  Kick questionnaire    Fear of Current or Ex-Partner: No    Emotionally Abused: No    Physically Abused: No    Sexually Abused: No    Outpatient Encounter Medications as of 01/21/2023  Medication Sig   acetaminophen (TYLENOL) 650 MG CR tablet Take 650 mg by mouth as needed for pain.   aspirin EC 81 MG tablet Take 1 tablet (81 mg total) by mouth daily. Swallow whole.   lisinopril (ZESTRIL) 5 MG tablet Take 1 tablet (5 mg total) by mouth daily.   loratadine (CLARITIN) 10 MG tablet Take 10 mg by mouth daily.   propranolol (INDERAL) 20 MG tablet Take 1 tablet (20 mg total) by mouth 2 (two) times daily.   [DISCONTINUED] atorvastatin (LIPITOR) 10 MG tablet Take 1 tablet (10 mg total) by mouth at bedtime.    atorvastatin (LIPITOR) 10 MG tablet Take 1 tablet (10 mg total) by mouth at bedtime.   No facility-administered encounter medications on file as of 01/21/2023.    Allergies  Allergen Reactions   Demerol [Meperidine] Nausea Only    Review of Systems  Constitutional:  Negative for activity change, appetite change, chills, diaphoresis, fatigue and unexpected weight change.  Eyes:  Positive for visual disturbance (absence of right eye).  Respiratory:  Negative for cough and shortness of breath.   Cardiovascular:  Negative for chest pain, palpitations and leg swelling.  Gastrointestinal:  Negative for abdominal pain, nausea and vomiting.  Endocrine: Negative for polydipsia, polyphagia and polyuria.  Genitourinary:  Negative for decreased urine volume and difficulty urinating.  Musculoskeletal:  Positive for arthralgias, back pain and gait problem.  Neurological:  Positive for tremors. Negative for dizziness, seizures, syncope, facial asymmetry, speech difficulty, weakness, light-headedness, numbness and headaches.  Psychiatric/Behavioral:  Negative for confusion.   All other systems reviewed and are negative.       Objective:  BP 131/73   Pulse 88   Temp 99.1 F (37.3 C) (Temporal)   Ht 6\' 1"  (1.854 m)   Wt 173 lb 9.6 oz (78.7 kg)   SpO2 96%   BMI 22.90 kg/m    Wt Readings from Last 3 Encounters:  01/21/23 173 lb 9.6 oz (78.7 kg)  10/07/22 177 lb 9.6 oz (80.6 kg)  07/30/22 177 lb (80.3 kg)    Physical Exam Vitals and nursing note reviewed.  Constitutional:      General: He is not in acute distress.    Appearance: Normal appearance. He is normal weight. He is not ill-appearing, toxic-appearing or diaphoretic.  HENT:     Head: Normocephalic and atraumatic.     Nose: Nose normal.     Mouth/Throat:     Mouth: Mucous membranes are moist.  Eyes:     Comments: Right eye absent   Cardiovascular:     Rate and Rhythm: Normal rate and regular rhythm.     Heart sounds: Normal  heart sounds.  Pulmonary:     Effort: Pulmonary effort is normal.     Breath sounds: Normal breath sounds.  Musculoskeletal:     Cervical back: Neck supple.     Right lower leg: No edema.     Left lower leg: No edema.     Comments: Kyphosis  Skin:    General: Skin is warm and dry.     Capillary Refill: Capillary refill takes less than 2 seconds.  Neurological:     General: No focal deficit present.     Mental Status: He is alert and oriented to  person, place, and time.     Motor: Tremor (bilateral hands) present.     Gait: Gait abnormal (using cane).  Psychiatric:        Mood and Affect: Mood normal.        Behavior: Behavior normal.        Thought Content: Thought content normal.        Judgment: Judgment normal.     Results for orders placed or performed in visit on 10/13/22  CMP14+EGFR  Result Value Ref Range   Glucose 98 70 - 99 mg/dL   BUN 13 8 - 27 mg/dL   Creatinine, Ser 8.29 0.76 - 1.27 mg/dL   eGFR 83 >56 OZ/HYQ/6.57   BUN/Creatinine Ratio 14 10 - 24   Sodium 140 134 - 144 mmol/L   Potassium 5.2 3.5 - 5.2 mmol/L   Chloride 104 96 - 106 mmol/L   CO2 23 20 - 29 mmol/L   Calcium 8.9 8.6 - 10.2 mg/dL   Total Protein 6.6 6.0 - 8.5 g/dL   Albumin 4.5 3.8 - 4.8 g/dL   Globulin, Total 2.1 1.5 - 4.5 g/dL   Albumin/Globulin Ratio 2.1 1.2 - 2.2   Bilirubin Total 1.1 0.0 - 1.2 mg/dL   Alkaline Phosphatase 76 44 - 121 IU/L   AST 23 0 - 40 IU/L   ALT 13 0 - 44 IU/L       Pertinent labs & imaging results that were available during my care of the patient were reviewed by me and considered in my medical decision making.  Assessment & Plan:  Hallet was seen today for hypertension.  Diagnoses and all orders for this visit:  Primary hypertension BP well controlled. Changes were not made in regimen today. Goal BP is 130/80. Pt aware to report any persistent high or low readings. DASH diet and exercise encouraged. Exercise at least 150 minutes per week and increase as  tolerated. Goal BMI > 25. Stress management encouraged. Avoid nicotine and tobacco product use. Avoid excessive alcohol and NSAID's. Avoid more than 2000 mg of sodium daily. Medications as prescribed. Follow up as scheduled.  -     BMP8+EGFR  Atherosclerosis of aorta (HCC) Diet encouraged - increase intake of fresh fruits and vegetables, increase intake of lean proteins. Bake, broil, or grill foods. Avoid fried, greasy, and fatty foods. Avoid fast foods. Increase intake of fiber-rich whole grains. Exercise encouraged - at least 150 minutes per week and advance as tolerated.  Goal BMI < 25. Continue medications as prescribed. Follow up in 3-6 months as discussed.  -     atorvastatin (LIPITOR) 10 MG tablet; Take 1 tablet (10 mg total) by mouth at bedtime.  Essential tremor Doing well on current medications, will continue.     Continue all other maintenance medications.  Follow up plan: Return in about 6 months (around 07/24/2023), or if symptoms worsen or fail to improve, for CPE.   Continue healthy lifestyle choices, including diet (rich in fruits, vegetables, and lean proteins, and low in salt and simple carbohydrates) and exercise (at least 30 minutes of moderate physical activity daily).  Educational handout given for DASH diet, HTN  The above assessment and management plan was discussed with the patient. The patient verbalized understanding of and has agreed to the management plan. Patient is aware to call the clinic if they develop any new symptoms or if symptoms persist or worsen. Patient is aware when to return to the clinic for a follow-up visit. Patient educated on  when it is appropriate to go to the emergency department.   Kari Baars, FNP-C [facility] [phone]

## 2023-03-10 ENCOUNTER — Other Ambulatory Visit: Payer: Self-pay | Admitting: Family Medicine

## 2023-03-10 DIAGNOSIS — I10 Essential (primary) hypertension: Secondary | ICD-10-CM

## 2023-03-10 DIAGNOSIS — G250 Essential tremor: Secondary | ICD-10-CM

## 2023-05-28 ENCOUNTER — Other Ambulatory Visit: Payer: Self-pay | Admitting: Family Medicine

## 2023-05-28 DIAGNOSIS — I10 Essential (primary) hypertension: Secondary | ICD-10-CM

## 2023-05-28 DIAGNOSIS — G250 Essential tremor: Secondary | ICD-10-CM

## 2023-07-09 ENCOUNTER — Other Ambulatory Visit: Payer: Self-pay | Admitting: Family Medicine

## 2023-07-09 DIAGNOSIS — I10 Essential (primary) hypertension: Secondary | ICD-10-CM

## 2023-07-09 MED ORDER — LISINOPRIL 5 MG PO TABS
5.0000 mg | ORAL_TABLET | Freq: Every day | ORAL | 0 refills | Status: DC
Start: 2023-07-09 — End: 2023-07-29

## 2023-07-09 NOTE — Telephone Encounter (Signed)
 Last Fill: 05/28/23  Last OV: 01/21/23 Next OV: 07/29/23  Routing to provider for review/authorization.

## 2023-07-09 NOTE — Telephone Encounter (Signed)
 Copied from CRM [id number]. Topic: Clinical - Medication Refill >> Jul 09, 2023 11:58 AM Farrel B wrote: Most Recent Primary Care Visit:  Provider: SEVERA ROCK HERO  Department: [facility]  Visit Type: OFFICE VISIT  Date: 01/21/2023  Medication:  lisinopril  (ZESTRIL ) 5 MG tablet   Has the patient contacted their pharmacy? No (Agent: If no, request that the patient contact the pharmacy for the refill. If patient does not wish to contact the pharmacy document the reason why and proceed with request.) (Agent: If yes, when and what did the pharmacy advise?)  Is this the correct pharmacy for this prescription? Yes If no, delete pharmacy and type the correct one.  This is the patient's preferred pharmacy:  [id number] - [place], [place] - [address] [place] [address] Phone: [phone] Fax: [phone]   Has the prescription been filled recently? Yes  Is the patient out of the medication? No he has 4 days left   Has the patient been seen for an appointment in the last year OR does the patient have an upcoming appointment? Yes  Can we respond through MyChart? No  Agent: Please be advised that Rx refills may take up to 3 business days. We ask that you follow-up with your pharmacy.

## 2023-07-29 ENCOUNTER — Ambulatory Visit (INDEPENDENT_AMBULATORY_CARE_PROVIDER_SITE_OTHER): Payer: 59 | Admitting: Family Medicine

## 2023-07-29 ENCOUNTER — Encounter: Payer: Self-pay | Admitting: Family Medicine

## 2023-07-29 VITALS — BP 153/74 | HR 70 | Temp 97.7°F | Ht 73.0 in | Wt 176.6 lb

## 2023-07-29 DIAGNOSIS — Z0000 Encounter for general adult medical examination without abnormal findings: Secondary | ICD-10-CM

## 2023-07-29 DIAGNOSIS — R202 Paresthesia of skin: Secondary | ICD-10-CM

## 2023-07-29 DIAGNOSIS — Z125 Encounter for screening for malignant neoplasm of prostate: Secondary | ICD-10-CM | POA: Diagnosis not present

## 2023-07-29 DIAGNOSIS — Z0001 Encounter for general adult medical examination with abnormal findings: Secondary | ICD-10-CM

## 2023-07-29 DIAGNOSIS — H906 Mixed conductive and sensorineural hearing loss, bilateral: Secondary | ICD-10-CM

## 2023-07-29 DIAGNOSIS — Z1159 Encounter for screening for other viral diseases: Secondary | ICD-10-CM | POA: Diagnosis not present

## 2023-07-29 DIAGNOSIS — I700 Atherosclerosis of aorta: Secondary | ICD-10-CM

## 2023-07-29 DIAGNOSIS — G250 Essential tremor: Secondary | ICD-10-CM

## 2023-07-29 DIAGNOSIS — I10 Essential (primary) hypertension: Secondary | ICD-10-CM

## 2023-07-29 DIAGNOSIS — R200 Anesthesia of skin: Secondary | ICD-10-CM

## 2023-07-29 MED ORDER — ATORVASTATIN CALCIUM 10 MG PO TABS: 10.0000 mg | ORAL_TABLET | Freq: Every day | ORAL | 1 refills | Status: DC

## 2023-07-29 MED ORDER — LISINOPRIL 5 MG PO TABS: 5.0000 mg | ORAL_TABLET | Freq: Every day | ORAL | 3 refills | Status: DC

## 2023-07-29 MED ORDER — PROPRANOLOL HCL 20 MG PO TABS
20.0000 mg | ORAL_TABLET | Freq: Two times a day (BID) | ORAL | 1 refills | Status: DC
Start: 2023-07-29 — End: 2024-01-19

## 2023-07-29 NOTE — Patient Instructions (Signed)
 Goal BP:  For patients younger than 60: Goal BP < 140/90. For patients 60 and older: Goal BP < 150/90. For patients with diabetes: Goal BP < 140/90.  Take your medications faithfully as prescribed. Maintain a healthy weight. Get at least 150 minutes of aerobic exercise per week. Minimize salt intake, less than 2000 mg per day. Minimize alcohol intake.  DASH Eating Plan DASH stands for "Dietary Approaches to Stop Hypertension." The DASH eating plan is a healthy eating plan that has been shown to reduce high blood pressure (hypertension). Additional health benefits may include reducing the risk of type 2 diabetes mellitus, heart disease, and stroke. The DASH eating plan may also help with weight loss.  WHAT DO I NEED TO KNOW ABOUT THE DASH EATING PLAN? For the DASH eating plan, you will follow these general guidelines: Choose foods with a percent daily value for sodium of less than 5% (as listed on the food label). Use salt-free seasonings or herbs instead of table salt or sea salt. Check with your health care provider or pharmacist before using salt substitutes. Eat lower-sodium products, often labeled as "lower sodium" or "no salt added." Eat fresh foods. Eat more vegetables, fruits, and low-fat dairy products. Choose whole grains. Look for the word "whole" as the first word in the ingredient list. Choose fish and skinless chicken or [place] more often than red meat. Limit fish, poultry, and meat to 6 oz (170 g) each day. Limit sweets, desserts, sugars, and sugary drinks. Choose heart-healthy fats. Limit cheese to 1 oz (28 g) per day. Eat more home-cooked food and less restaurant, buffet, and fast food. Limit fried foods. Cook foods using methods other than frying. Limit canned vegetables. If you do use them, rinse them well to decrease the sodium. When eating at a restaurant, ask that your food be prepared with less salt, or no salt if possible.  WHAT FOODS CAN I EAT? Seek help from  a dietitian for individual calorie needs.  Grains Whole grain or whole wheat bread. Brown rice. Whole grain or whole wheat pasta. Quinoa, bulgur, and whole grain cereals. Low-sodium cereals. Corn or whole wheat flour tortillas. Whole grain cornbread. Whole grain crackers. Low-sodium crackers.  Vegetables Fresh or frozen vegetables (raw, steamed, roasted, or grilled). Low-sodium or reduced-sodium tomato and vegetable juices. Low-sodium or reduced-sodium tomato sauce and paste. Low-sodium or reduced-sodium canned vegetables.   Fruits All fresh, canned (in natural juice), or frozen fruits.  Meat and Other Protein Products Ground beef (85% or leaner), grass-fed beef, or beef trimmed of fat. Skinless chicken or [place]. Ground chicken or [place]. Pork trimmed of fat. All fish and seafood. Eggs. Dried beans, peas, or lentils. Unsalted nuts and seeds. Unsalted canned beans.  Dairy Low-fat dairy products, such as skim or 1% milk, 2% or reduced-fat cheeses, low-fat ricotta or cottage cheese, or plain low-fat yogurt. Low-sodium or reduced-sodium cheeses.  Fats and Oils Tub margarines without trans fats. Light or reduced-fat mayonnaise and salad dressings (reduced sodium). Avocado. Safflower, olive, or canola oils. Natural peanut or almond butter.  Other Unsalted popcorn and pretzels. The items listed above may not be a complete list of recommended foods or beverages. Contact your dietitian for more options.  WHAT FOODS ARE NOT RECOMMENDED?  Grains White bread. White pasta. White rice. Refined cornbread. Bagels and croissants. Crackers that contain trans fat.  Vegetables Creamed or fried vegetables. Vegetables in a cheese sauce. Regular canned vegetables. Regular canned tomato sauce and paste. Regular tomato and vegetable juices.  Fruits Dried fruits. Canned fruit in light or heavy syrup. Fruit juice.  Meat and Other Protein Products Fatty cuts of meat. Ribs, chicken wings, bacon, sausage,  bologna, salami, chitterlings, fatback, hot dogs, bratwurst, and packaged luncheon meats. Salted nuts and seeds. Canned beans with salt.  Dairy Whole or 2% milk, cream, half-and-half, and cream cheese. Whole-fat or sweetened yogurt. Full-fat cheeses or blue cheese. Nondairy creamers and whipped toppings. Processed cheese, cheese spreads, or cheese curds.  Condiments Onion and garlic salt, seasoned salt, table salt, and sea salt. Canned and packaged gravies. Worcestershire sauce. Tartar sauce. Barbecue sauce. Teriyaki sauce. Soy sauce, including reduced sodium. Steak sauce. Fish sauce. Oyster sauce. Cocktail sauce. Horseradish. Ketchup and mustard. Meat flavorings and tenderizers. Bouillon cubes. Hot sauce. Tabasco sauce. Marinades. Taco seasonings. Relishes.  Fats and Oils Butter, stick margarine, lard, shortening, ghee, and bacon fat. Coconut, palm kernel, or palm oils. Regular salad dressings.  Other Pickles and olives. Salted popcorn and pretzels.  The items listed above may not be a complete list of foods and beverages to avoid. Contact your dietitian for more information.  WHERE CAN I FIND MORE INFORMATION? National Heart, Lung, and Blood Institute: [email or web address] Document Released: 05/08/2011 Document Revised: 10/03/2013 Document Reviewed: 03/23/2013 [facility] Patient Information 2015 [place], [place]. This information is not intended to replace advice given to you by your health care provider. Make sure you discuss any questions you have with your health care provider.   I think that you would greatly benefit from seeing a nutritionist.  If you are interested, please call Dr. Gerilyn Pilgrim at [phone] to schedule an appointment.

## 2023-07-29 NOTE — Progress Notes (Signed)
 Complete physical exam  Patient: Frank Haas   DOB: 08-07-1945   78 y.o. Male  MRN: [id number]  Subjective:    Chief Complaint  Patient presents with   Annual Exam    Frank Haas is a 78 y.o. male who presents today for a complete physical exam. He reports consuming a general diet. The patient does not participate in regular exercise at present. He generally feels fairly well. He reports sleeping well. He does have additional problems to discuss today.    The patient, with hypertension and hyperlipidemia, presents for a routine follow-up visit.  He has been experiencing elevated blood pressure due to missing his lisinopril for the past two weeks. He has been taking a lower dose of lisinopril from his wife's medication, which he describes as 'half the dose of his.' He is supposed to be taking 5 mg daily. Additionally, he is on atorvastatin 10 mg and propranolol for tremor management.  He acknowledges issues with his hearing and has tried over-the-counter hearing aids, which improved his hearing significantly, but he is reluctant to pursue further hearing aids. He mentions needing to have his hearing checked.  No significant changes in bowel or bladder habits. He reports sleeping well, although he sometimes feels he could sleep too much. He does not frequently wake up at night to urinate, estimating getting up once a night to urinate. He also reports intermittent numbness and tingling in his feet.   Most recent fall risk assessment:    07/29/2023   10:32 AM  Fall Risk   Falls in the past year? 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   Scoliosis 04/02/2021   Past Medical History:  Diagnosis Date   Blind right eye    Scoliosis    Past Surgical History:  Procedure Laterality Date   EYE SURGERY     right eye removed   Social History   Tobacco Use   Smoking status: Former    Types: Cigarettes   Smokeless tobacco: Never  Vaping Use   Vaping status: Never Used  Substance Use Topics   Alcohol use: No   Drug use: No   Social History   Socioeconomic History   Marital status: Married    Spouse name: Not on file   Number of children: Not on file   Years of education: Not on file   Highest education level: Not on file  Occupational History   Not on file  Tobacco Use   Smoking status: Former    Types: Cigarettes   Smokeless tobacco: Never  Vaping Use   Vaping status: Never Used  Substance and Sexual Activity   Alcohol use: No   Drug use: No   Sexual activity: Not on file  Other Topics Concern   Not on file  Social History Narrative   Not on file   Social Drivers of Health   Financial Resource Strain: Low Risk  (07/30/2022)   Overall Financial Resource Strain (CARDIA)    Difficulty of Paying Living Expenses: Not hard at all  Food Insecurity: No Food Insecurity (07/30/2022)   Hunger Vital Sign    Worried About Running Out of Food in the Last Year: Never true    Ran Out of Food in the Last Year: Never true  Transportation Needs: No Transportation Needs (07/30/2022)   PRAPARE - Administrator, Civil Service (Medical): No    Lack of Transportation (Non-Medical): No  Physical Activity: Insufficiently Active (07/30/2022)   Exercise Vital Sign    Days of Exercise per Week: 3 days    Minutes of Exercise per Session: 30 min  Stress: No Stress Concern  Present (07/30/2022)   [employer] of Occupational Health - Occupational Stress Questionnaire    Feeling of Stress : Not at all  Social Connections: Moderately Integrated (07/30/2022)   Social Connection and Isolation Panel [NHANES]    Frequency of Communication with Friends and Family: More than three times a week    Frequency of Social Gatherings with Friends and Family: Three times a week    Attends Religious Services: 1 to 4 times per year    Active Member of Clubs or Organizations: No    Attends Banker Meetings: Never    Marital Status: Married  Catering manager Violence: Not At Risk (07/30/2022)   Humiliation, Afraid, Rape, and Kick questionnaire    Fear of Current or Ex-Partner: No    Emotionally Abused: No    Physically Abused: No    Sexually Abused: No   Family Status  Relation Name Status   Mother  Deceased   Father  Deceased   MGM  Deceased   MGF  Deceased   PGM  Deceased   PGF  Deceased  No partnership data on file   Family History  Problem Relation Age of Onset   Coronary artery disease Mother    Heart attack Father    Allergies  Allergen Reactions   Demerol [Meperidine] Nausea Only      Patient Care Team: Sonny Masters, FNP as PCP - General (Family Medicine)   Outpatient Medications Prior to Visit  Medication Sig   acetaminophen (TYLENOL) 650 MG CR tablet Take 650 mg by mouth as needed for pain.   aspirin EC 81 MG tablet Take 1 tablet (81 mg total) by mouth daily. Swallow whole.   loratadine (CLARITIN) 10 MG tablet Take 10 mg by mouth daily.   [DISCONTINUED] atorvastatin (LIPITOR) 10 MG tablet Take 1 tablet (10 mg total) by mouth at bedtime.   [DISCONTINUED] lisinopril (ZESTRIL) 5 MG tablet Take 1 tablet (5 mg total) by mouth daily. (Patient not taking: Reported on 07/29/2023)   [DISCONTINUED] propranolol (INDERAL) 20 MG tablet TAKE 1 TABLET (20 MG) BY MOUTH 2 (TWO) TIMES DAILY   No facility-administered medications prior to visit.     ROS per HPI     Objective:     BP (!) 153/74   Pulse 70   Temp 97.7 F (36.5 C)   Ht 6\' 1"  (1.854 m)   Wt 176 lb 9.6 oz (80.1 kg)   SpO2 96%   BMI 23.30 kg/m  BP Readings from Last 3 Encounters:  07/29/23 (!) 153/74  01/21/23 131/73  10/07/22 (!) 153/73   Wt Readings from Last 3 Encounters:  07/29/23 176 lb 9.6 oz (80.1 kg)  01/21/23 173 lb 9.6 oz (78.7 kg)  10/07/22 177 lb 9.6 oz (80.6 kg)   SpO2 Readings from Last 3 Encounters:  07/29/23 96%  01/21/23 96%  10/07/22 98%      Physical Exam Vitals and nursing note reviewed.  Constitutional:      General: He is not in acute distress.    Appearance: Normal appearance. He is well-developed, well-groomed and normal weight. He is not ill-appearing, toxic-appearing or diaphoretic.  HENT:     Head: Normocephalic and atraumatic.     Jaw: There is normal jaw occlusion.     Right Ear: Hearing, tympanic membrane, ear canal and external ear normal.     Left Ear: Hearing, tympanic membrane, ear canal and external ear normal.     Nose: Nose normal.     Mouth/Throat:     Lips: Pink.     Mouth: Mucous membranes are moist.     Pharynx: Oropharynx is clear. Uvula midline.  Eyes:     General: Lids are normal.     Comments: Right eye absent  Neck:     Thyroid: No thyroid mass, thyromegaly or thyroid tenderness.     Vascular: No carotid bruit or JVD.     Trachea: Trachea and phonation normal.  Cardiovascular:     Rate and Rhythm: Normal rate and regular rhythm.     Chest Wall: PMI is not displaced.     Pulses:          Dorsalis pedis pulses are 2+ on the right side and 2+ on the left side.       Posterior tibial pulses are 2+ on the right side and 2+ on the left side.     Heart sounds: Normal heart sounds. No murmur heard.    No friction rub. No gallop.  Pulmonary:     Effort: Pulmonary effort is normal. No respiratory distress.     Breath sounds: Normal breath sounds. No wheezing.  Chest:     Chest wall: No mass.   Breasts:    Breasts are symmetrical.  Abdominal:     General: Abdomen is flat. Bowel sounds are normal. There is no distension or abdominal bruit.     Palpations: Abdomen is soft. There is no hepatomegaly or splenomegaly.     Tenderness: There is no abdominal tenderness. There is no right CVA tenderness or left CVA tenderness.     Hernia: No hernia is present.  Musculoskeletal:        General: Normal range of motion.     Cervical back: Full passive range of motion without pain, normal range of motion and neck supple.     Right lower leg: No edema.     Left lower leg: No edema.     Comments: Kyphosis   Feet:     Right foot:     Skin integrity: Skin integrity normal.     Toenail Condition: Right toenails are normal.     Left foot:     Skin integrity: Skin integrity normal.     Toenail Condition: Left toenails are normal.  Lymphadenopathy:     Cervical: No cervical adenopathy.     Upper Body:     Right upper body: No supraclavicular, axillary or pectoral adenopathy.     Left upper body: No supraclavicular, axillary or pectoral adenopathy.  Skin:    General: Skin is warm and dry.     Capillary Refill: Capillary refill takes less  than 2 seconds.     Coloration: Skin is not cyanotic, jaundiced or pale.     Findings: No rash.  Neurological:     General: No focal deficit present.     Mental Status: He is alert and oriented to person, place, and time.     Sensory: Sensation is intact.     Motor: Motor function is intact.     Coordination: Coordination is intact.     Gait: Gait abnormal (using cane).     Deep Tendon Reflexes: Reflexes are normal and symmetric.  Psychiatric:        Attention and Perception: Attention and perception normal.        Mood and Affect: Mood and affect normal.        Speech: Speech normal.        Behavior: Behavior normal. Behavior is cooperative.        Thought Content: Thought content normal.        Cognition and Memory: Cognition and memory normal.         Judgment: Judgment normal.       Last CBC Lab Results  Component Value Date   WBC 16.0 (H) 10/07/2022   HGB 13.6 10/07/2022   HCT 44.0 10/07/2022   MCV 82 10/07/2022   MCH 25.2 (L) 10/07/2022   RDW 30.2 (H) 10/07/2022   PLT 405 10/07/2022   Last metabolic panel Lab Results  Component Value Date   GLUCOSE 104 (H) 01/21/2023   NA 140 01/21/2023   K 5.0 01/21/2023   CL 101 01/21/2023   CO2 24 01/21/2023   BUN 18 01/21/2023   CREATININE 1.24 01/21/2023   EGFR 61 01/21/2023   CALCIUM 9.3 01/21/2023   PROT 6.6 10/13/2022   ALBUMIN 4.5 10/13/2022   LABGLOB 2.1 10/13/2022   AGRATIO 2.1 10/13/2022   BILITOT 1.1 10/13/2022   ALKPHOS 76 10/13/2022   AST 23 10/13/2022   ALT 13 10/13/2022   Last lipids Lab Results  Component Value Date   CHOL 102 10/07/2022   HDL 39 (L) 10/07/2022   LDLCALC 48 10/07/2022   TRIG 74 10/07/2022   CHOLHDL 2.6 10/07/2022    Last thyroid functions Lab Results  Component Value Date   TSH 1.660 10/07/2022   T4TOTAL 8.3 10/07/2022   Last vitamin D Lab Results  Component Value Date   VD25OH 16.3 (L) 10/08/2021    Assessment & Plan:    Routine Health Maintenance and Physical Exam  Immunization History  Administered Date(s) Administered   Fluad Quad(high Dose 65+) 04/02/2021, 04/08/2022   PFIZER(Purple Top)SARS-COV-2 Vaccination 08/25/2019, 09/17/2019, 05/31/2020   Zoster Recombinant(Shingrix) 10/08/2021, 12/23/2021    Health Maintenance  Topic Date Due   Hepatitis C Screening  Never done   Medicare Annual Wellness (AWV)  07/31/2023   COVID-19 Vaccine (4 - 2024-25 season) 08/14/2023 (Originally 02/01/2023)   INFLUENZA VACCINE  08/31/2023 (Originally 01/01/2023)   DTaP/Tdap/Td (1 - Tdap) 07/28/2024 (Originally 10/16/1964)   Pneumonia Vaccine 25+ Years old (1 of 1 - PCV) 07/28/2024 (Originally 10/15/2012)   Colonoscopy  07/28/2024 (Originally 10/15/1992)   Zoster Vaccines- Shingrix  Completed   HPV VACCINES  Aged Out    Discussed  health benefits of physical activity, and encouraged him to engage in regular exercise appropriate for his age and condition.  Problem List Items Addressed This Visit       Cardiovascular and Mediastinum   Atherosclerosis of aorta (HCC)   Relevant Medications   propranolol (INDERAL) 20 MG tablet  atorvastatin (LIPITOR) 10 MG tablet   lisinopril (ZESTRIL) 5 MG tablet   Other Relevant Orders   Lipid panel   CMP14+EGFR   Primary hypertension   Relevant Medications   propranolol (INDERAL) 20 MG tablet   atorvastatin (LIPITOR) 10 MG tablet   lisinopril (ZESTRIL) 5 MG tablet   Other Relevant Orders   Lipid panel   Thyroid Panel With TSH   CBC with Differential/Platelet   CMP14+EGFR     Nervous and Auditory   Essential tremor   Relevant Medications   propranolol (INDERAL) 20 MG tablet   Mixed conductive and sensorineural hearing loss of both ears   Relevant Orders   Ambulatory referral to Audiology   Other Visit Diagnoses       Annual physical exam    -  Primary   Relevant Orders   Lipid panel   Thyroid Panel With TSH   CBC with Differential/Platelet   CMP14+EGFR   Hepatitis C Antibody   PSA, total and free     Need for hepatitis C screening test       Relevant Orders   Hepatitis C Antibody     Screening for prostate cancer       Relevant Orders   PSA, total and free     Numbness and tingling of both feet       Relevant Orders   CBC with Differential/Platelet   CMP14+EGFR   Vitamin B12     Assessment and Plan    Hypertension Hypertension is not well-controlled due to non-adherence to lisinopril. Blood pressure is elevated. Lisinopril is essential for managing hypertension, and he has been taking a lower dose than prescribed. - Restart lisinopril 5 mg daily - Monitor blood pressure to ensure control after restarting lisinopril  Tremor Tremor is managed with propranolol. He reports current management is effective. - Continue propranolol as  prescribed  Hyperlipidemia He is on atorvastatin 10 mg for hyperlipidemia. Cholesterol levels need to be checked to assess control. - Order cholesterol panel to assess current lipid levels  Hearing loss He reports significant hearing loss and uses hearing aids from a drugstore without benefit. Custom hearing aids from an audiologist may improve hearing. - Refer to audiologist for hearing evaluation and fitting of custom hearing aids  Vaccination status He is due for a pneumonia vaccine. Completion of the shingles vaccine is recommended. - Check vaccination status for pneumonia vaccine - Recommend completing the shingles vaccine  Follow-up Follow-up is necessary to monitor blood pressure control and hearing management. Coordination with his partner is advised for comprehensive care. - Schedule follow-up appointment in three months to assess blood pressure control and overall health - Ensure partner attends next appointment for comprehensive care       Return in about 3 months (around 10/26/2023) for HTN.     Kari Baars, FNP

## 2023-07-30 LAB — CMP14+EGFR
ALT: 19 IU/L (ref 0–44)
AST: 23 IU/L (ref 0–40)
Albumin: 4.3 g/dL (ref 3.8–4.8)
Alkaline Phosphatase: 60 IU/L (ref 44–121)
BUN/Creatinine Ratio: 9 — ABNORMAL LOW (ref 10–24)
BUN: 9 mg/dL (ref 8–27)
Bilirubin Total: 1.8 mg/dL — ABNORMAL HIGH (ref 0.0–1.2)
CO2: 24 mmol/L (ref 20–29)
Calcium: 8.8 mg/dL (ref 8.6–10.2)
Chloride: 102 mmol/L (ref 96–106)
Creatinine, Ser: 1.05 mg/dL (ref 0.76–1.27)
Globulin, Total: 2.2 g/dL (ref 1.5–4.5)
Glucose: 93 mg/dL (ref 70–99)
Potassium: 4.9 mmol/L (ref 3.5–5.2)
Sodium: 142 mmol/L (ref 134–144)
Total Protein: 6.5 g/dL (ref 6.0–8.5)
eGFR: 74 mL/min/{1.73_m2} (ref >59)

## 2023-07-30 LAB — LIPID PANEL
Chol/HDL Ratio: 3.4 ratio (ref 0.0–5.0)
Cholesterol, Total: 114 mg/dL (ref 100–199)
HDL: 34 mg/dL — ABNORMAL LOW (ref >39)
LDL Chol Calc (NIH): 63 mg/dL (ref 0–99)
Triglycerides: 86 mg/dL (ref 0–149)
VLDL Cholesterol Cal: 17 mg/dL (ref 5–40)

## 2023-07-30 LAB — CBC WITH DIFFERENTIAL/PLATELET
Basophils Absolute: 0.8 10*3/uL — ABNORMAL HIGH (ref 0.0–0.2)
Basos: 4 %
EOS (ABSOLUTE): 3.2 10*3/uL — ABNORMAL HIGH (ref 0.0–0.4)
Eos: 18 %
Hematocrit: 46.1 % (ref 37.5–51.0)
Hemoglobin: 14 g/dL (ref 13.0–17.7)
Immature Grans (Abs): 0.2 10*3/uL — ABNORMAL HIGH (ref 0.0–0.1)
Immature Granulocytes: 1 %
Lymphocytes Absolute: 1.7 10*3/uL (ref 0.7–3.1)
Lymphs: 9 %
MCH: 25.8 pg — ABNORMAL LOW (ref 26.6–33.0)
MCHC: 30.4 g/dL — ABNORMAL LOW (ref 31.5–35.7)
MCV: 85 fL (ref 79–97)
Monocytes Absolute: 0.3 10*3/uL (ref 0.1–0.9)
Monocytes: 2 %
Neutrophils Absolute: 11.6 10*3/uL — ABNORMAL HIGH (ref 1.4–7.0)
Neutrophils: 66 %
Platelets: 366 10*3/uL (ref 150–450)
RBC: 5.42 x10E6/uL (ref 4.14–5.80)
RDW: 29.5 % — ABNORMAL HIGH (ref 11.6–15.4)
WBC: 17.7 10*3/uL — ABNORMAL HIGH (ref 3.4–10.8)

## 2023-07-30 LAB — THYROID PANEL WITH TSH
Free Thyroxine Index: 1.8 (ref 1.2–4.9)
T3 Uptake Ratio: 24 % (ref 24–39)
T4, Total: 7.4 ug/dL (ref 4.5–12.0)
TSH: 1.34 u[IU]/mL (ref 0.450–4.500)

## 2023-07-30 LAB — PSA, TOTAL AND FREE
PSA, Free Pct: 50 %
PSA, Free: 0.3 ng/mL
Prostate Specific Ag, Serum: 0.6 ng/mL (ref 0.0–4.0)

## 2023-07-30 LAB — HEPATITIS C ANTIBODY: Hep C Virus Ab: NONREACTIVE

## 2023-07-31 LAB — VITAMIN B12

## 2023-07-31 LAB — SPECIMEN STATUS REPORT

## 2023-10-27 ENCOUNTER — Telehealth: Payer: Self-pay | Admitting: Family Medicine

## 2023-10-30 ENCOUNTER — Encounter: Payer: Self-pay | Admitting: Family Medicine

## 2023-10-30 ENCOUNTER — Ambulatory Visit (INDEPENDENT_AMBULATORY_CARE_PROVIDER_SITE_OTHER): Payer: 59 | Admitting: Family Medicine

## 2023-10-30 VITALS — BP 149/74 | HR 70 | Temp 98.1°F | Ht 73.0 in | Wt 164.2 lb

## 2023-10-30 DIAGNOSIS — H9193 Unspecified hearing loss, bilateral: Secondary | ICD-10-CM

## 2023-10-30 DIAGNOSIS — I10 Essential (primary) hypertension: Secondary | ICD-10-CM | POA: Diagnosis not present

## 2023-10-30 DIAGNOSIS — I700 Atherosclerosis of aorta: Secondary | ICD-10-CM | POA: Diagnosis not present

## 2023-10-30 DIAGNOSIS — M62838 Other muscle spasm: Secondary | ICD-10-CM

## 2023-10-30 MED ORDER — LISINOPRIL 5 MG PO TABS: 5.0000 mg | ORAL_TABLET | Freq: Every day | ORAL | 3 refills | Status: DC

## 2023-10-30 MED ORDER — ATORVASTATIN CALCIUM 10 MG PO TABS
10.0000 mg | ORAL_TABLET | Freq: Every day | ORAL | 3 refills | Status: AC
Start: 2023-10-30 — End: ?

## 2023-10-30 NOTE — Progress Notes (Signed)
 Subjective:  Patient ID: Frank Haas, male    DOB: 1946-05-17, 78 y.o.   MRN: [id number]  Patient Care Team: Galvin Jules, FNP as PCP - General (Family Medicine)   Chief Complaint:  Hypertension (3 month follow up )   HPI: Frank Haas is a 78 y.o. male presenting on 10/30/2023 for Hypertension (3 month follow up )  Frank Haas is a 78 year old male with hypertension and hyperlipidemia who presents for medication refill and shoulder pain.  He has been out of his blood pressure medication, lisinopril , and his cholesterol medication for the past week. No chest pain, leg swelling, shortness of breath, weakness, or confusion. He denies significant muscle aches or pains associated with his cholesterol medication.  He experiences pain in the shoulder blade area, described as a numbing sensation, ongoing for about one to two weeks. He has not tried changing his pillow, although he mentions a recent change, which might be contributing to the discomfort. He uses Tylenol for pain relief, which he finds effective.  He has a hearing problem and notes difficulty hearing low tones, which has been a persistent issue.          Relevant past medical, surgical, family, and social history reviewed and updated as indicated.  Allergies and medications reviewed and updated. Data reviewed: Chart in Epic.   Past Medical History:  Diagnosis Date   Blind right eye    Scoliosis     Past Surgical History:  Procedure Laterality Date   EYE SURGERY     right eye removed    Social History   Socioeconomic History   Marital status: Married    Spouse name: Not on file   Number of children: Not on file   Years of education: Not on file   Highest education level: Not on file  Occupational History   Not on file  Tobacco Use   Smoking status: Former    Types: Cigarettes   Smokeless tobacco: Never  Vaping Use   Vaping status: Never Used  Substance and Sexual Activity   Alcohol use: No    Drug use: No   Sexual activity: Not on file  Other Topics Concern   Not on file  Social History Narrative   Not on file   Social Drivers of Health   Financial Resource Strain: Low Risk  (07/30/2022)   Overall Financial Resource Strain (CARDIA)    Difficulty of Paying Living Expenses: Not hard at all  Food Insecurity: No Food Insecurity (07/30/2022)   Hunger Vital Sign    Worried About Running Out of Food in the Last Year: Never true    Ran Out of Food in the Last Year: Never true  Transportation Needs: No Transportation Needs (07/30/2022)   PRAPARE - Administrator, Civil Service (Medical): No    Lack of Transportation (Non-Medical): No  Physical Activity: Insufficiently Active (07/30/2022)   Exercise Vital Sign    Days of Exercise per Week: 3 days    Minutes of Exercise per Session: 30 min  Stress: No Stress Concern Present (07/30/2022)   [employer] of Occupational Health - Occupational Stress Questionnaire    Feeling of Stress : Not at all  Social Connections: Moderately Integrated (07/30/2022)   Social Connection and Isolation Panel [NHANES]    Frequency of Communication with Friends and Family: More than three times a week    Frequency of Social Gatherings with Friends and Family: Three times  a week    Attends Religious Services: 1 to 4 times per year    Active Member of Clubs or Organizations: No    Attends Banker Meetings: Never    Marital Status: Married  Catering manager Violence: Not At Risk (07/30/2022)   Humiliation, Afraid, Rape, and Kick questionnaire    Fear of Current or Ex-Partner: No    Emotionally Abused: No    Physically Abused: No    Sexually Abused: No    Outpatient Encounter Medications as of 10/30/2023  Medication Sig   acetaminophen (TYLENOL) 650 MG CR tablet Take 650 mg by mouth as needed for pain.   aspirin  EC 81 MG tablet Take 1 tablet (81 mg total) by mouth daily. Swallow whole.   loratadine (CLARITIN) 10 MG tablet  Take 10 mg by mouth daily.   propranolol  (INDERAL ) 20 MG tablet Take 1 tablet (20 mg total) by mouth 2 (two) times daily.   [DISCONTINUED] atorvastatin  (LIPITOR) 10 MG tablet Take 1 tablet (10 mg total) by mouth at bedtime.   [DISCONTINUED] lisinopril  (ZESTRIL ) 5 MG tablet Take 1 tablet (5 mg total) by mouth daily.   atorvastatin  (LIPITOR) 10 MG tablet Take 1 tablet (10 mg total) by mouth at bedtime.   lisinopril  (ZESTRIL ) 5 MG tablet Take 1 tablet (5 mg total) by mouth daily.   No facility-administered encounter medications on file as of 10/30/2023.    Allergies  Allergen Reactions   Demerol [Meperidine] Nausea Only    Pertinent ROS per HPI, otherwise unremarkable      Objective:  BP (!) 149/74   Pulse 70   Temp 98.1 F (36.7 C)   Ht 6\' 1"  (1.854 m)   Wt 164 lb 3.2 oz (74.5 kg)   SpO2 96%   BMI 21.66 kg/m    Wt Readings from Last 3 Encounters:  10/30/23 164 lb 3.2 oz (74.5 kg)  07/29/23 176 lb 9.6 oz (80.1 kg)  01/21/23 173 lb 9.6 oz (78.7 kg)    Physical Exam Vitals and nursing note reviewed.  Constitutional:      Appearance: Normal appearance. He is normal weight.  HENT:     Head: Normocephalic and atraumatic.     Right Ear: Tympanic membrane, ear canal and external ear normal. Decreased hearing noted.     Left Ear: Tympanic membrane, ear canal and external ear normal. Decreased hearing noted.     Nose: Nose normal.     Mouth/Throat:     Mouth: Mucous membranes are moist.     Pharynx: Oropharynx is clear.  Eyes:     Conjunctiva/sclera: Conjunctivae normal.     Comments: Right eye absent  Cardiovascular:     Rate and Rhythm: Normal rate and regular rhythm.     Heart sounds: Normal heart sounds.  Pulmonary:     Effort: Pulmonary effort is normal.     Breath sounds: Normal breath sounds.  Musculoskeletal:     Right shoulder: Normal.     Left shoulder: Normal.     Cervical back: Neck supple. Spasms present.       Back:     Comments: Kyphosis   Skin:     General: Skin is warm and dry.     Capillary Refill: Capillary refill takes less than 2 seconds.  Neurological:     General: No focal deficit present.     Mental Status: He is alert and oriented to person, place, and time.     Gait: Gait abnormal (using cane).  Psychiatric:        Mood and Affect: Mood normal.        Behavior: Behavior normal.        Thought Content: Thought content normal.        Judgment: Judgment normal.     Results for orders placed or performed in visit on 07/29/23  Lipid panel   Collection Time: 07/29/23 11:02 AM  Result Value Ref Range   Cholesterol, Total 114 100 - 199 mg/dL   Triglycerides 86 0 - 149 mg/dL   HDL 34 (L) >28 mg/dL   VLDL Cholesterol Cal 17 5 - 40 mg/dL   LDL Chol Calc (NIH) 63 0 - 99 mg/dL   Chol/HDL Ratio 3.4 0.0 - 5.0 ratio  Thyroid  Panel With TSH   Collection Time: 07/29/23 11:02 AM  Result Value Ref Range   TSH 1.340 0.450 - 4.500 uIU/mL   T4, Total 7.4 4.5 - 12.0 ug/dL   T3 Uptake Ratio 24 24 - 39 %   Free Thyroxine Index 1.8 1.2 - 4.9  CBC with Differential/Platelet   Collection Time: 07/29/23 11:02 AM  Result Value Ref Range   WBC 17.7 (H) 3.4 - 10.8 x10E3/uL   RBC 5.42 4.14 - 5.80 x10E6/uL   Hemoglobin 14.0 13.0 - 17.7 g/dL   Hematocrit 41.3 24.4 - 51.0 %   MCV 85 79 - 97 fL   MCH 25.8 (L) 26.6 - 33.0 pg   MCHC 30.4 (L) 31.5 - 35.7 g/dL   RDW 01.0 (H) 27.2 - 53.6 %   Platelets 366 150 - 450 x10E3/uL   Neutrophils 66 Not Estab. %   Lymphs 9 Not Estab. %   Monocytes 2 Not Estab. %   Eos 18 Not Estab. %   Basos 4 Not Estab. %   Neutrophils Absolute 11.6 (H) 1.4 - 7.0 x10E3/uL   Lymphocytes Absolute 1.7 0.7 - 3.1 x10E3/uL   Monocytes Absolute 0.3 0.1 - 0.9 x10E3/uL   EOS (ABSOLUTE) 3.2 (H) 0.0 - 0.4 x10E3/uL   Basophils Absolute 0.8 (H) 0.0 - 0.2 x10E3/uL   Immature Granulocytes 1 Not Estab. %   Immature Grans (Abs) 0.2 (H) 0.0 - 0.1 x10E3/uL   Hematology Comments: Note:   CMP14+EGFR   Collection Time: 07/29/23  11:02 AM  Result Value Ref Range   Glucose 93 70 - 99 mg/dL   BUN 9 8 - 27 mg/dL   Creatinine, Ser 6.44 0.76 - 1.27 mg/dL   eGFR 74 >03 KV/QQV/9.56   BUN/Creatinine Ratio 9 (L) 10 - 24   Sodium 142 134 - 144 mmol/L   Potassium 4.9 3.5 - 5.2 mmol/L   Chloride 102 96 - 106 mmol/L   CO2 24 20 - 29 mmol/L   Calcium  8.8 8.6 - 10.2 mg/dL   Total Protein 6.5 6.0 - 8.5 g/dL   Albumin 4.3 3.8 - 4.8 g/dL   Globulin, Total 2.2 1.5 - 4.5 g/dL   Bilirubin Total 1.8 (H) 0.0 - 1.2 mg/dL   Alkaline Phosphatase 60 44 - 121 IU/L   AST 23 0 - 40 IU/L   ALT 19 0 - 44 IU/L  Hepatitis C Antibody   Collection Time: 07/29/23 11:02 AM  Result Value Ref Range   Hep C Virus Ab Non Reactive Non Reactive  PSA, total and free   Collection Time: 07/29/23 11:02 AM  Result Value Ref Range   Prostate Specific Ag, Serum 0.6 0.0 - 4.0 ng/mL   PSA, Free 0.30 N/A ng/mL  PSA, Free Pct 50.0 %  Vitamin B12   Collection Time: 07/29/23 11:02 AM  Result Value Ref Range   Vitamin B-12 >2000 (H) 232 - 1245 pg/mL  Specimen status report   Collection Time: 07/29/23 11:02 AM  Result Value Ref Range   specimen status report Comment        Pertinent labs & imaging results that were available during my care of the patient were reviewed by me and considered in my medical decision making.  Assessment & Plan:  Jaskaran was seen today for hypertension.  Diagnoses and all orders for this visit:  Primary hypertension -     lisinopril  (ZESTRIL ) 5 MG tablet; Take 1 tablet (5 mg total) by mouth daily. -     BMP8+EGFR  Atherosclerosis of aorta (HCC) -     atorvastatin  (LIPITOR) 10 MG tablet; Take 1 tablet (10 mg total) by mouth at bedtime.  Trapezius muscle spasm Discussed symptomatic care.   Decreased hearing of both ears -     Ambulatory referral to Audiology    Trapezius muscle strain Trapezius muscle strain likely due to recent change in pillow, presenting with pain and numbness in the shoulder blade area for one  to two weeks. Muscle spasm and tightness present on examination. - Advise use of topical treatments such as Biofreeze or Icy Hot several times daily to relax the muscle. - Suggest adjusting to the new pillow to alleviate symptoms. - Instruct to report if symptoms do not improve.  Hypertension Hypertension with recent lapse in medication adherence, resulting in slightly elevated blood pressure. No chest pain, leg swelling, shortness of breath, weakness, or confusion reported. - Send prescription for lisinopril  to pharmacy. - Instruct to resume taking lisinopril  as prescribed.  Hyperlipidemia Hyperlipidemia with recent lapse in medication adherence. No significant muscle aches or pains reported with cholesterol medication. Tylenol is effective for occasional pain relief. - Send prescription for cholesterol medication to pharmacy. - Instruct to resume taking cholesterol medication as prescribed.  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 No cerumen impaction observed. Referral to audiologist for further evaluation and potential hearing aid fitting. Inquiry about insurance coverage for hearing aids. - Refer to audiologist for hearing evaluation and potential hearing aid fitting. - Advise to inquire about insurance coverage for hearing aids during audiologist appointment.           Continue all other maintenance medications.  Follow up plan: Return in about 6 months (around 05/01/2024) for chronic follow up, labs.   Continue healthy lifestyle choices, including diet (rich in fruits, vegetables, and lean proteins, and low in salt and simple carbohydrates) and exercise (at least 30 minutes of moderate physical activity daily).  Educational handout given for DASH diet  The above assessment and management plan was discussed with the patient. The patient verbalized understanding of and has agreed to the management plan. Patient is aware to call the clinic if they develop  any new symptoms or if symptoms persist or worsen. Patient is aware when to return to the clinic for a follow-up visit. Patient educated on when it is appropriate to go to the emergency department.   Kattie Parrot, FNP-C [facility] [phone]

## 2023-10-30 NOTE — Patient Instructions (Signed)
 Goal BP:  For patients younger than 60: Goal BP < 140/90. For patients 60 and older: Goal BP < 150/90. For patients with diabetes: Goal BP < 140/90.  Take your medications faithfully as prescribed. Maintain a healthy weight. Get at least 150 minutes of aerobic exercise per week. Minimize salt intake, less than 2000 mg per day. Minimize alcohol intake.  DASH Eating Plan DASH stands for "Dietary Approaches to Stop Hypertension." The DASH eating plan is a healthy eating plan that has been shown to reduce high blood pressure (hypertension). Additional health benefits may include reducing the risk of type 2 diabetes mellitus, heart disease, and stroke. The DASH eating plan may also help with weight loss.  WHAT DO I NEED TO KNOW ABOUT THE DASH EATING PLAN? For the DASH eating plan, you will follow these general guidelines: Choose foods with a percent daily value for sodium of less than 5% (as listed on the food label). Use salt-free seasonings or herbs instead of table salt or sea salt. Check with your health care provider or pharmacist before using salt substitutes. Eat lower-sodium products, often labeled as "lower sodium" or "no salt added." Eat fresh foods. Eat more vegetables, fruits, and low-fat dairy products. Choose whole grains. Look for the word "whole" as the first word in the ingredient list. Choose fish and skinless chicken or [place] more often than red meat. Limit fish, poultry, and meat to 6 oz (170 g) each day. Limit sweets, desserts, sugars, and sugary drinks. Choose heart-healthy fats. Limit cheese to 1 oz (28 g) per day. Eat more home-cooked food and less restaurant, buffet, and fast food. Limit fried foods. Cook foods using methods other than frying. Limit canned vegetables. If you do use them, rinse them well to decrease the sodium. When eating at a restaurant, ask that your food be prepared with less salt, or no salt if possible.  WHAT FOODS CAN I EAT? Seek help from  a dietitian for individual calorie needs.  Grains Whole grain or whole wheat bread. Brown rice. Whole grain or whole wheat pasta. Quinoa, bulgur, and whole grain cereals. Low-sodium cereals. Corn or whole wheat flour tortillas. Whole grain cornbread. Whole grain crackers. Low-sodium crackers.  Vegetables Fresh or frozen vegetables (raw, steamed, roasted, or grilled). Low-sodium or reduced-sodium tomato and vegetable juices. Low-sodium or reduced-sodium tomato sauce and paste. Low-sodium or reduced-sodium canned vegetables.   Fruits All fresh, canned (in natural juice), or frozen fruits.  Meat and Other Protein Products Ground beef (85% or leaner), grass-fed beef, or beef trimmed of fat. Skinless chicken or [place]. Ground chicken or [place]. Pork trimmed of fat. All fish and seafood. Eggs. Dried beans, peas, or lentils. Unsalted nuts and seeds. Unsalted canned beans.  Dairy Low-fat dairy products, such as skim or 1% milk, 2% or reduced-fat cheeses, low-fat ricotta or cottage cheese, or plain low-fat yogurt. Low-sodium or reduced-sodium cheeses.  Fats and Oils Tub margarines without trans fats. Light or reduced-fat mayonnaise and salad dressings (reduced sodium). Avocado. Safflower, olive, or canola oils. Natural peanut or almond butter.  Other Unsalted popcorn and pretzels. The items listed above may not be a complete list of recommended foods or beverages. Contact your dietitian for more options.  WHAT FOODS ARE NOT RECOMMENDED?  Grains White bread. White pasta. White rice. Refined cornbread. Bagels and croissants. Crackers that contain trans fat.  Vegetables Creamed or fried vegetables. Vegetables in a cheese sauce. Regular canned vegetables. Regular canned tomato sauce and paste. Regular tomato and vegetable juices.  Fruits Dried fruits. Canned fruit in light or heavy syrup. Fruit juice.  Meat and Other Protein Products Fatty cuts of meat. Ribs, chicken wings, bacon, sausage,  bologna, salami, chitterlings, fatback, hot dogs, bratwurst, and packaged luncheon meats. Salted nuts and seeds. Canned beans with salt.  Dairy Whole or 2% milk, cream, half-and-half, and cream cheese. Whole-fat or sweetened yogurt. Full-fat cheeses or blue cheese. Nondairy creamers and whipped toppings. Processed cheese, cheese spreads, or cheese curds.  Condiments Onion and garlic salt, seasoned salt, table salt, and sea salt. Canned and packaged gravies. Worcestershire sauce. Tartar sauce. Barbecue sauce. Teriyaki sauce. Soy sauce, including reduced sodium. Steak sauce. Fish sauce. Oyster sauce. Cocktail sauce. Horseradish. Ketchup and mustard. Meat flavorings and tenderizers. Bouillon cubes. Hot sauce. Tabasco sauce. Marinades. Taco seasonings. Relishes.  Fats and Oils Butter, stick margarine, lard, shortening, ghee, and bacon fat. Coconut, palm kernel, or palm oils. Regular salad dressings.  Other Pickles and olives. Salted popcorn and pretzels.  The items listed above may not be a complete list of foods and beverages to avoid. Contact your dietitian for more information.  WHERE CAN I FIND MORE INFORMATION? National Heart, Lung, and Blood Institute: [email or web address] Document Released: 05/08/2011 Document Revised: 10/03/2013 Document Reviewed: 03/23/2013 [facility] Patient Information 2015 [place], [place]. This information is not intended to replace advice given to you by your health care provider. Make sure you discuss any questions you have with your health care provider.   I think that you would greatly benefit from seeing a nutritionist.  If you are interested, please call Dr. Gerilyn Pilgrim at [phone] to schedule an appointment.

## 2023-10-31 LAB — BMP8+EGFR
BUN/Creatinine Ratio: 10 (ref 10–24)
BUN: 11 mg/dL (ref 8–27)
CO2: 21 mmol/L (ref 20–29)
Calcium: 8.9 mg/dL (ref 8.6–10.2)
Chloride: 99 mmol/L (ref 96–106)
Creatinine, Ser: 1.13 mg/dL (ref 0.76–1.27)
Glucose: 100 mg/dL — ABNORMAL HIGH (ref 70–99)
Potassium: 4.8 mmol/L (ref 3.5–5.2)
Sodium: 139 mmol/L (ref 134–144)
eGFR: 67 mL/min/{1.73_m2} (ref >59)

## 2023-11-01 ENCOUNTER — Ambulatory Visit: Payer: Self-pay | Admitting: Family Medicine

## 2023-11-24 ENCOUNTER — Ambulatory Visit

## 2024-01-12 ENCOUNTER — Ambulatory Visit

## 2024-01-12 VITALS — BP 149/74 | HR 70 | Ht 73.0 in | Wt 164.0 lb

## 2024-01-12 DIAGNOSIS — Z0000 Encounter for general adult medical examination without abnormal findings: Secondary | ICD-10-CM

## 2024-01-12 NOTE — Progress Notes (Signed)
 Subjective:   Frank Haas is a 78 y.o. who presents for a Medicare Wellness preventive visit.  As a reminder, Annual Wellness Visits don't include a physical exam, and some assessments may be limited, especially if this visit is performed virtually. We may recommend an in-person follow-up visit with your provider if needed.  Visit Complete: Virtual I connected with  Frank Haas on 01/12/24 by a audio enabled telemedicine application and verified that I am speaking with the correct person using two identifiers.  Patient Location: Home  Provider Location: Home Office  I discussed the limitations of evaluation and management by telemedicine. The patient expressed understanding and agreed to proceed.  Vital Signs: Because this visit was a virtual/telehealth visit, some criteria may be missing or patient reported. Any vitals not documented were not able to be obtained and vitals that have been documented are patient reported.  VideoDeclined- This patient declined Librarian, academic. Therefore the visit was completed with audio only.  Persons Participating in Visit: Patient.  AWV Questionnaire: No: Patient Medicare AWV questionnaire was not completed prior to this visit.  Cardiac Risk Factors include: advanced age (>40men, >64 women);hypertension     Objective:    Today's Vitals   01/12/24 1504  BP: (!) 149/74  Pulse: 70  Weight: 164 lb (74.4 kg)  Height: 6' 1 (1.854 m)   Body mass index is 21.64 kg/m.     01/12/2024    3:10 PM 07/30/2022   11:00 AM 05/06/2022    8:23 AM 06/05/2016   12:04 PM  Advanced Directives  Does Patient Have a Medical Advance Directive? No Yes No No   Type of Special educational needs teacher of [place];Living will    Does patient want to make changes to medical advance directive?  No - Patient declined    Copy of Healthcare Power of Attorney in Chart?  No - copy requested    Would patient like information on creating  a medical advance directive?   No - Patient declined      Data saved with a previous flowsheet row definition    Current Medications (verified) Outpatient Encounter Medications as of 01/12/2024  Medication Sig   acetaminophen (TYLENOL) 650 MG CR tablet Take 650 mg by mouth as needed for pain.   aspirin  EC 81 MG tablet Take 1 tablet (81 mg total) by mouth daily. Swallow whole.   atorvastatin  (LIPITOR) 10 MG tablet Take 1 tablet (10 mg total) by mouth at bedtime.   lisinopril  (ZESTRIL ) 5 MG tablet Take 1 tablet (5 mg total) by mouth daily.   loratadine (CLARITIN) 10 MG tablet Take 10 mg by mouth daily.   propranolol  (INDERAL ) 20 MG tablet Take 1 tablet (20 mg total) by mouth 2 (two) times daily.   No facility-administered encounter medications on file as of 01/12/2024.    Allergies (verified) Demerol [meperidine]   History: Past Medical History:  Diagnosis Date   Blind right eye    Scoliosis    Past Surgical History:  Procedure Laterality Date   EYE SURGERY     right eye removed   Family History  Problem Relation Age of Onset   Coronary artery disease Mother    Heart attack Father    Social History   Socioeconomic History   Marital status: Married    Spouse name: Not on file   Number of children: Not on file   Years of education: Not on file   Highest education level:  Not on file  Occupational History   Not on file  Tobacco Use   Smoking status: Former    Types: Cigarettes   Smokeless tobacco: Never  Vaping Use   Vaping status: Never Used  Substance and Sexual Activity   Alcohol use: No   Drug use: No   Sexual activity: Not on file  Other Topics Concern   Not on file  Social History Narrative   Not on file   Social Drivers of Health   Financial Resource Strain: Low Risk  (01/12/2024)   Overall Financial Resource Strain (CARDIA)    Difficulty of Paying Living Expenses: Not hard at all  Food Insecurity: No Food Insecurity (01/12/2024)   Hunger Vital Sign     Worried About Running Out of Food in the Last Year: Never true    Ran Out of Food in the Last Year: Never true  Transportation Needs: No Transportation Needs (01/12/2024)   PRAPARE - Administrator, Civil Service (Medical): No    Lack of Transportation (Non-Medical): No  Physical Activity: Inactive (01/12/2024)   Exercise Vital Sign    Days of Exercise per Week: 0 days    Minutes of Exercise per Session: 0 min  Stress: No Stress Concern Present (01/12/2024)   [employer] of Occupational Health - Occupational Stress Questionnaire    Feeling of Stress: Not at all  Social Connections: Socially Isolated (01/12/2024)   Social Connection and Isolation Panel    Frequency of Communication with Friends and Family: Once a week    Frequency of Social Gatherings with Friends and Family: Once a week    Attends Religious Services: Never    Database administrator or Organizations: No    Attends Engineer, structural: Never    Marital Status: Married    Tobacco Counseling Counseling given: Yes    Clinical Intake:  Pre-visit preparation completed: Yes  Pain : No/denies pain     BMI - recorded: 21.64 Nutritional Status: BMI of 19-24  Normal Nutritional Risks: None Diabetes: No  No results found for: HGBA1C   How often do you need to have someone help you when you read instructions, pamphlets, or other written materials from your doctor or pharmacy?: 1 - Never  Interpreter Needed?: No  Information entered by :: alia t/cma   Activities of Daily Living     01/12/2024    3:10 PM  In your present state of health, do you have any difficulty performing the following activities:  Hearing? 1  Vision? 1  Difficulty concentrating or making decisions? 0  Walking or climbing stairs? 0  Dressing or bathing? 0  Doing errands, shopping? 1  Comment pt's wife Insurance claims handler and eating ? N  Using the Toilet? N  In the past six months, have you accidently  leaked urine? N  Do you have problems with loss of bowel control? Y  Managing your Medications? N  Managing your Finances? Y  Comment pt's wife  Housekeeping or managing your Housekeeping? N    Patient Care Team: Severa Rock HERO, FNP as PCP - General (Family Medicine)  I have updated your Care Teams any recent Medical Services you may have received from other providers in the past year.     Assessment:   This is a routine wellness examination for [place].  Hearing/Vision screen Hearing Screening - Comments:: Pt had hard time hearing  Vision Screening - Comments:: Pt wear glasses/pt only have one good eye (  L)/pt goes [facility] in [place], [place]/last ov yr ago   Goals Addressed             This Visit's Progress    Remain active   On track      Depression Screen     01/12/2024    3:20 PM 10/30/2023    8:30 AM 07/29/2023   10:32 AM 01/21/2023   10:48 AM 10/07/2022    9:05 AM 07/30/2022   10:59 AM 04/08/2022    9:27 AM  PHQ 2/9 Scores  PHQ - 2 Score 1 3 1  0 0 0 0  PHQ- 9 Score  3 1 0 4  0    Fall Risk     01/12/2024    3:06 PM 10/30/2023    8:29 AM 07/29/2023   10:32 AM 01/21/2023   10:48 AM 10/07/2022    9:05 AM  Fall Risk   Falls in the past year? 0 0 0 0 0  Number falls in past yr: 0 0     Injury with Fall? 0 0     Risk for fall due to : No Fall Risks No Fall Risks No Fall Risks    Follow up Falls evaluation completed Falls evaluation completed Falls evaluation completed      MEDICARE RISK AT HOME:  Medicare Risk at Home Any stairs in or around the home?: Yes If so, are there any without handrails?: Yes Home free of loose throw rugs in walkways, pet beds, electrical cords, etc?: Yes Adequate lighting in your home to reduce risk of falls?: Yes Life alert?: No Use of a cane, walker or w/c?: Yes Grab bars in the bathroom?: Yes Shower chair or bench in shower?: Yes Elevated toilet seat or a handicapped toilet?: Yes  TIMED UP AND GO:  Was the test performed?   no  Cognitive Function: 6CIT completed        01/12/2024    3:21 PM 07/30/2022   11:01 AM  6CIT Screen  What Year? 4 points 0 points  What month? 0 points 0 points  What time? 0 points 0 points  Count back from 20 4 points 0 points  Months in reverse 4 points 0 points  Repeat phrase 10 points 2 points  Total Score 22 points 2 points    Immunizations Immunization History  Administered Date(s) Administered   Fluad Quad(high Dose 65+) 04/02/2021, 04/08/2022   PFIZER(Purple Top)SARS-COV-2 Vaccination 08/25/2019, 09/17/2019, 05/31/2020   Zoster Recombinant(Shingrix ) 10/08/2021, 12/23/2021    Screening Tests Health Maintenance  Topic Date Due   COVID-19 Vaccine (4 - 2024-25 season) 02/01/2023   INFLUENZA VACCINE  01/01/2024   DTaP/Tdap/Td (1 - Tdap) 07/28/2024 (Originally 10/16/1964)   Pneumococcal Vaccine: 50+ Years (1 of 1 - PCV) 07/28/2024 (Originally 10/15/1997)   Medicare Annual Wellness (AWV)  01/11/2025   Hepatitis C Screening  Completed   Zoster Vaccines- Shingrix   Completed   Hepatitis B Vaccines  Aged Out   HPV VACCINES  Aged Out   Meningococcal B Vaccine  Aged Out    Health Maintenance  Health Maintenance Due  Topic Date Due   COVID-19 Vaccine (4 - 2024-25 season) 02/01/2023   INFLUENZA VACCINE  01/01/2024   Health Maintenance Items Addressed: See Nurse Notes at the end of this note  Additional Screening:  Vision Screening: Recommended annual ophthalmology exams for early detection of glaucoma and other disorders of the eye. Would you like a referral to an eye doctor? No    Dental Screening: Recommended  annual dental exams for proper oral hygiene  Community Resource Referral / Chronic Care Management: CRR required this visit?  No   CCM required this visit?  No   Plan:    I have personally reviewed and noted the following in the patient's chart:   Medical and social history Use of alcohol, tobacco or illicit drugs  Current medications and  supplements including opioid prescriptions. Patient is not currently taking opioid prescriptions. Functional ability and status Nutritional status Physical activity Advanced directives List of other physicians Hospitalizations, surgeries, and ER visits in previous 12 months Vitals Screenings to include cognitive, depression, and falls Referrals and appointments  In addition, I have reviewed and discussed with patient certain preventive protocols, quality metrics, and best practice recommendations. A written personalized care plan for preventive services as well as general preventive health recommendations were provided to patient.   Ozie Ned, CMA   01/12/2024   After Visit Summary: (Declined) Due to this being a telephonic visit, with patients personalized plan was offered to patient but patient Declined AVS at this time   Notes: PCP Follow Up Recommendations: pt's 6CIT=22

## 2024-01-12 NOTE — Patient Instructions (Addendum)
 Mr. Frank Haas , Thank you for taking time out of your busy schedule to complete your Annual Wellness Visit with me. I enjoyed our conversation and look forward to speaking with you again next year. I, as well as your care team,  appreciate your ongoing commitment to your health goals. Please review the following plan we discussed and let me know if I can assist you in the future. Your Game plan/ To Do List    Referrals: If you haven't heard from the office you've been referred to, please reach out to them at the phone provided.   Follow up Visits: We will see or speak with you next year for your Next Medicare AWV with our clinical staff on 01/12/25 at 2:30p.m. Have you seen your provider in the last 6 months (3 months if uncontrolled diabetes)? Yes  Clinician Recommendations:  Aim for 30 minutes of exercise or brisk walking, 6-8 glasses of water, and 5 servings of fruits and vegetables each day.       This is a list of the screenings recommended for you:  Health Maintenance  Topic Date Due   COVID-19 Vaccine (4 - 2024-25 season) 02/01/2023   Medicare Annual Wellness Visit  07/31/2023   Flu Shot  01/01/2024   DTaP/Tdap/Td vaccine (1 - Tdap) 07/28/2024*   Pneumococcal Vaccine for age over 74 (1 of 1 - PCV) 07/28/2024*   Hepatitis C Screening  Completed   Zoster (Shingles) Vaccine  Completed   Hepatitis B Vaccine  Aged Out   HPV Vaccine  Aged Out   Meningitis B Vaccine  Aged Out  *Topic was postponed. The date shown is not the original due date.    Advanced directives: (Declined) Advance directive discussed with you today. Even though you declined this today, please call our office should you change your mind, and we can give you the proper paperwork for you to fill out. Advance Care Planning is important because it:  [x]  Makes sure you receive the medical care that is consistent with your values, goals, and preferences  [x]  It provides guidance to your family and loved ones and reduces  their decisional burden about whether or not they are making the right decisions based on your wishes.  Follow the link provided in your after visit summary or read over the paperwork we have mailed to you to help you started getting your Advance Directives in place. If you need assistance in completing these, please reach out to us  so that we can help you!  See attachments for Preventive Care and Fall Prevention Tips.

## 2024-01-19 ENCOUNTER — Other Ambulatory Visit: Payer: Self-pay | Admitting: Family Medicine

## 2024-01-19 DIAGNOSIS — I10 Essential (primary) hypertension: Secondary | ICD-10-CM

## 2024-01-19 DIAGNOSIS — G250 Essential tremor: Secondary | ICD-10-CM

## 2024-03-26 IMAGING — DX DG LUMBAR SPINE 2-3V
2 series · 2 of 2 positions shown · non-contrast
Comparison: Lumbar spine radiograph dated 05/14/2021.

CLINICAL DATA: Fall and back pain.

EXAM:
LUMBAR SPINE - 2-3 VIEW

[l-spine ap]
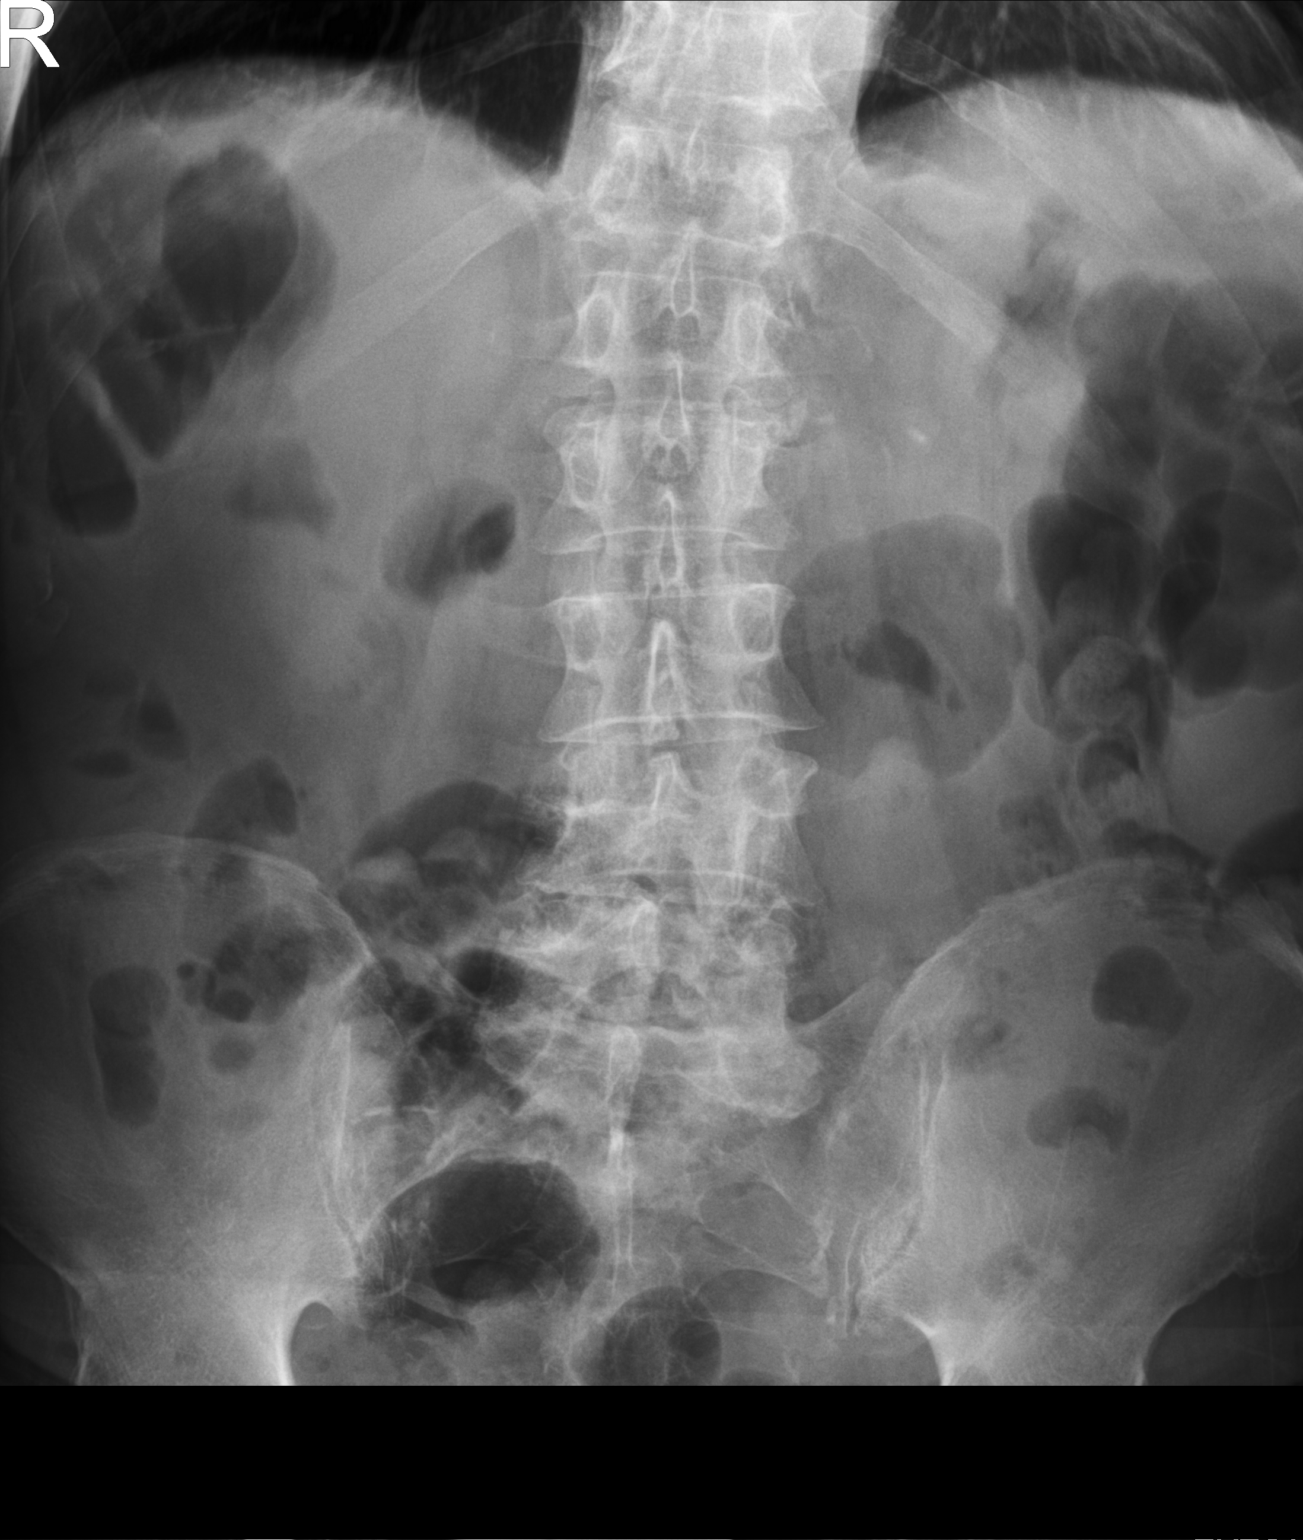

[l-spine lat]
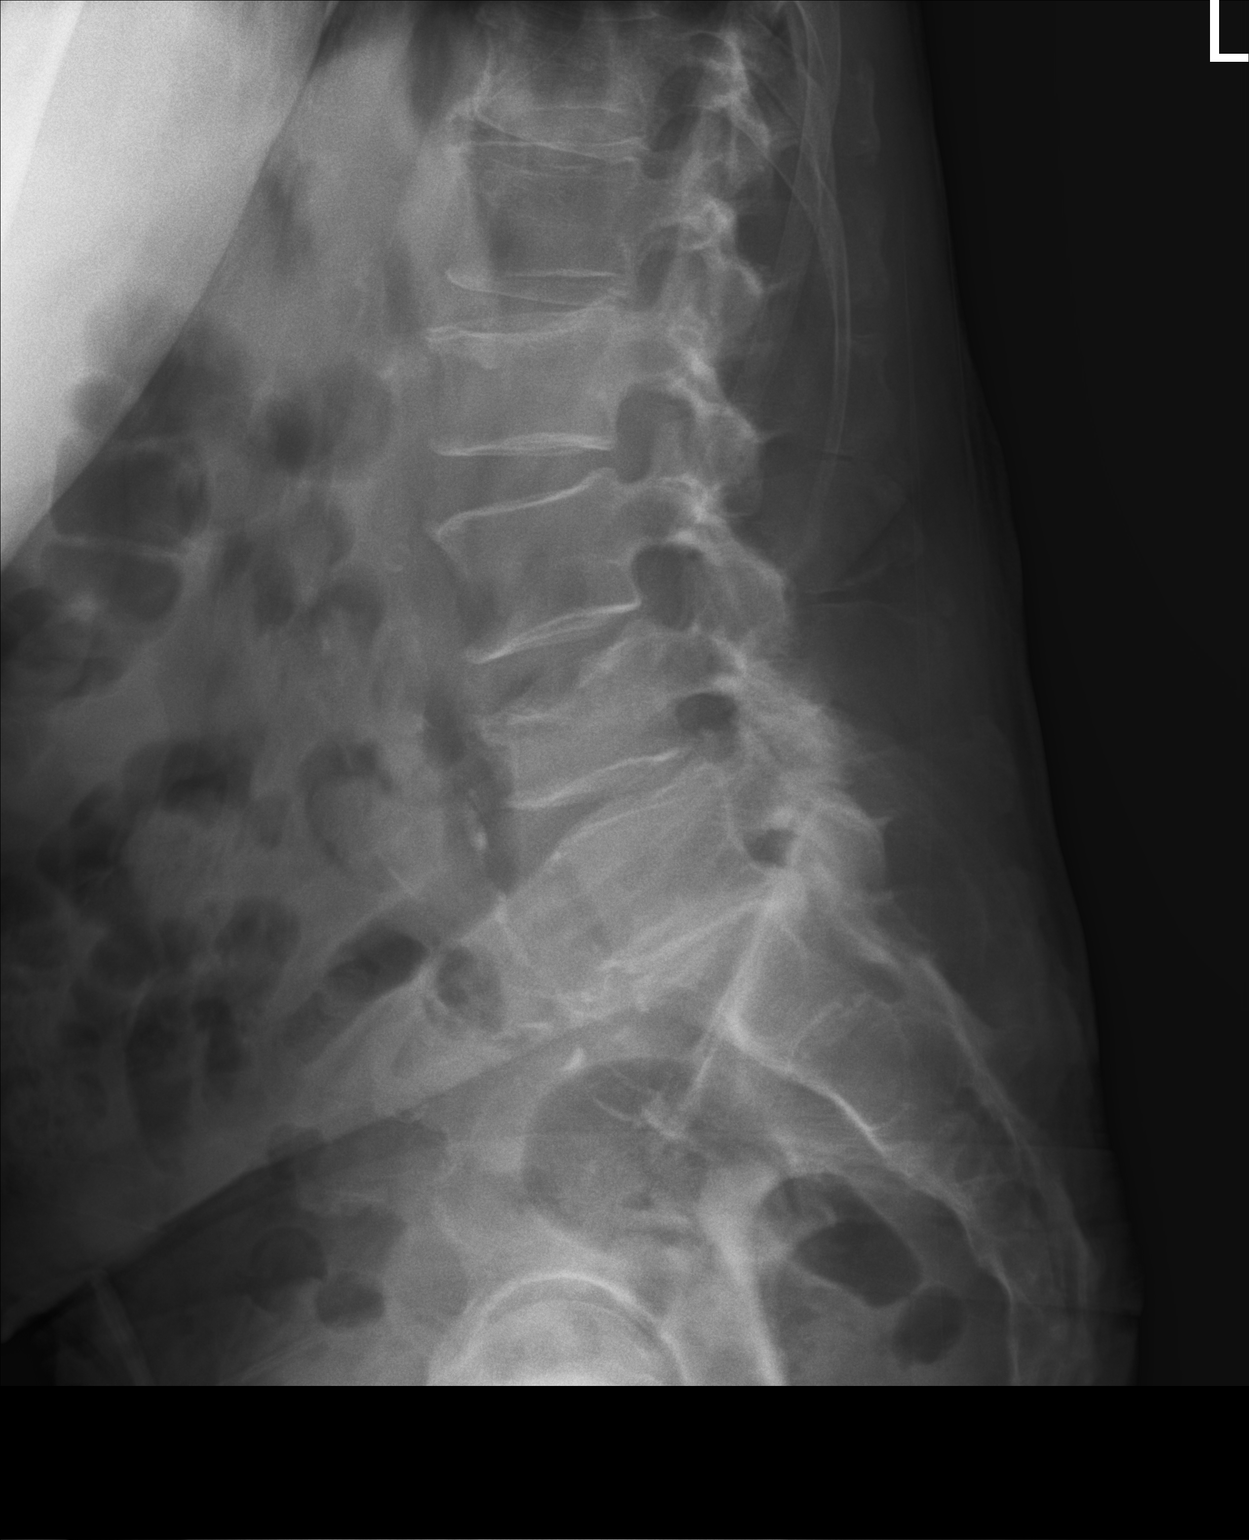

[2 of 2 positions shown; findings below may reference images not displayed]

FINDINGS: Five lumbar type vertebra. Age indeterminate compression fracture of
superior endplate of L4 with approximately 50% loss of vertebral
body height anteriorly. This is new since the prior radiograph.
Correlation with clinical exam and point tenderness recommended.
Similar or minimally progressed mild compression fracture of
superior endplate of L2. There multilevel degenerative changes.
Lower lumbar facet arthropathy. The visualized posterior elements
are intact. Atherosclerotic calcification of the abdominal aorta.
The soft tissues are unremarkable.
IMPRESSION: Age indeterminate compression fractures of the superior endplate of
L4.

## 2024-05-03 ENCOUNTER — Ambulatory Visit: Payer: Self-pay | Admitting: Family Medicine

## 2024-05-03 ENCOUNTER — Encounter: Payer: Self-pay | Admitting: Family Medicine

## 2024-05-03 DIAGNOSIS — I10 Essential (primary) hypertension: Secondary | ICD-10-CM

## 2024-05-13 ENCOUNTER — Ambulatory Visit: Admitting: Family Medicine

## 2024-05-13 ENCOUNTER — Encounter: Payer: Self-pay | Admitting: Family Medicine

## 2024-05-13 VITALS — BP 159/75 | HR 76 | Temp 97.9°F | Ht 73.0 in | Wt 165.4 lb

## 2024-05-13 DIAGNOSIS — M2550 Pain in unspecified joint: Secondary | ICD-10-CM | POA: Diagnosis not present

## 2024-05-13 DIAGNOSIS — M164 Bilateral post-traumatic osteoarthritis of hip: Secondary | ICD-10-CM | POA: Insufficient documentation

## 2024-05-13 DIAGNOSIS — E538 Deficiency of other specified B group vitamins: Secondary | ICD-10-CM | POA: Diagnosis not present

## 2024-05-13 DIAGNOSIS — E559 Vitamin D deficiency, unspecified: Secondary | ICD-10-CM | POA: Diagnosis not present

## 2024-05-13 DIAGNOSIS — F4322 Adjustment disorder with anxiety: Secondary | ICD-10-CM | POA: Insufficient documentation

## 2024-05-13 DIAGNOSIS — R0683 Snoring: Secondary | ICD-10-CM

## 2024-05-13 DIAGNOSIS — I10 Essential (primary) hypertension: Secondary | ICD-10-CM | POA: Diagnosis not present

## 2024-05-13 DIAGNOSIS — G250 Essential tremor: Secondary | ICD-10-CM | POA: Diagnosis not present

## 2024-05-13 DIAGNOSIS — R400 Somnolence: Secondary | ICD-10-CM

## 2024-05-13 DIAGNOSIS — Z23 Encounter for immunization: Secondary | ICD-10-CM

## 2024-05-13 DIAGNOSIS — H906 Mixed conductive and sensorineural hearing loss, bilateral: Secondary | ICD-10-CM

## 2024-05-13 DIAGNOSIS — E782 Mixed hyperlipidemia: Secondary | ICD-10-CM | POA: Insufficient documentation

## 2024-05-13 MED ORDER — LOSARTAN POTASSIUM 50 MG PO TABS: 50.0000 mg | ORAL_TABLET | Freq: Every day | ORAL | 3 refills | Status: AC

## 2024-05-13 NOTE — Patient Instructions (Signed)
 Goal BP:  Less than 130/80  Take your medications faithfully as prescribed. Maintain a healthy weight. Get at least 150 minutes of aerobic exercise per week. Minimize salt intake, less than 2000 mg per day. Minimize alcohol intake.  DASH Eating Plan DASH stands for Dietary Approaches to Stop Hypertension. The DASH eating plan is a healthy eating plan that has been shown to reduce high blood pressure (hypertension). Additional health benefits may include reducing the risk of type 2 diabetes mellitus, heart disease, and stroke. The DASH eating plan may also help with weight loss.  WHAT DO I NEED TO KNOW ABOUT THE DASH EATING PLAN? For the DASH eating plan, you will follow these general guidelines: Choose foods with a percent daily value for sodium of less than 5% (as listed on the food label). Use salt-free seasonings or herbs instead of table salt or sea salt. Check with your health care provider or pharmacist before using salt substitutes. Eat lower-sodium products, often labeled as lower sodium or no salt added. Eat fresh foods. Eat more vegetables, fruits, and low-fat dairy products. Choose whole grains. Look for the word whole as the first word in the ingredient list. Choose fish and skinless chicken or [place] more often than red meat. Limit fish, poultry, and meat to 6 oz (170 g) each day. Limit sweets, desserts, sugars, and sugary drinks. Choose heart-healthy fats. Limit cheese to 1 oz (28 g) per day. Eat more home-cooked food and less restaurant, buffet, and fast food. Limit fried foods. Cook foods using methods other than frying. Limit canned vegetables. If you do use them, rinse them well to decrease the sodium. When eating at a restaurant, ask that your food be prepared with less salt, or no salt if possible.  WHAT FOODS CAN I EAT? Seek help from a dietitian for individual calorie needs.  Grains Whole grain or whole wheat bread. Hoshino rice. Whole grain or whole  wheat pasta. Quinoa, bulgur, and whole grain cereals. Low-sodium cereals. Corn or whole wheat flour tortillas. Whole grain cornbread. Whole grain crackers. Low-sodium crackers.  Vegetables Fresh or frozen vegetables (raw, steamed, roasted, or grilled). Low-sodium or reduced-sodium tomato and vegetable juices. Low-sodium or reduced-sodium tomato sauce and paste. Low-sodium or reduced-sodium canned vegetables.   Fruits All fresh, canned (in natural juice), or frozen fruits.  Meat and Other Protein Products Ground beef (85% or leaner), grass-fed beef, or beef trimmed of fat. Skinless chicken or [place]. Ground chicken or [place]. Pork trimmed of fat. All fish and seafood. Eggs. Dried beans, peas, or lentils. Unsalted nuts and seeds. Unsalted canned beans.  Dairy Low-fat dairy products, such as skim or 1% milk, 2% or reduced-fat cheeses, low-fat ricotta or cottage cheese, or plain low-fat yogurt. Low-sodium or reduced-sodium cheeses.  Fats and Oils Tub margarines without trans fats. Light or reduced-fat mayonnaise and salad dressings (reduced sodium). Avocado. Safflower, olive, or canola oils. Natural peanut or almond butter.  Other Unsalted popcorn and pretzels. The items listed above may not be a complete list of recommended foods or beverages. Contact your dietitian for more options.  WHAT FOODS ARE NOT RECOMMENDED?  Grains White bread. White pasta. White rice. Refined cornbread. Bagels and croissants. Crackers that contain trans fat.  Vegetables Creamed or fried vegetables. Vegetables in a cheese sauce. Regular canned vegetables. Regular canned tomato sauce and paste. Regular tomato and vegetable juices.  Fruits Dried fruits. Canned fruit in light or heavy syrup. Fruit juice.  Meat and Other Protein Products Fatty cuts of meat. Ribs,  chicken wings, bacon, sausage, bologna, salami, chitterlings, fatback, hot dogs, bratwurst, and packaged luncheon meats. Salted nuts and seeds. Canned  beans with salt.  Dairy Whole or 2% milk, cream, half-and-half, and cream cheese. Whole-fat or sweetened yogurt. Full-fat cheeses or blue cheese. Nondairy creamers and whipped toppings. Processed cheese, cheese spreads, or cheese curds.  Condiments Onion and garlic salt, seasoned salt, table salt, and sea salt. Canned and packaged gravies. Worcestershire sauce. Tartar sauce. Barbecue sauce. Teriyaki sauce. Soy sauce, including reduced sodium. Steak sauce. Fish sauce. Oyster sauce. Cocktail sauce. Horseradish. Ketchup and mustard. Meat flavorings and tenderizers. Bouillon cubes. Hot sauce. Tabasco sauce. Marinades. Taco seasonings. Relishes.  Fats and Oils Butter, stick margarine, lard, shortening, ghee, and bacon fat. Coconut, palm kernel, or palm oils. Regular salad dressings.  Other Pickles and olives. Salted popcorn and pretzels.  The items listed above may not be a complete list of foods and beverages to avoid. Contact your dietitian for more information.  WHERE CAN I FIND MORE INFORMATION? National Heart, Lung, and Blood Institute: [employer] Document Released: 05/08/2011 Document Revised: 10/03/2013 Document Reviewed: 03/23/2013 [facility] Patient Information 2015 [place], [place]. This information is not intended to replace advice given to you by your health care provider. Make sure you discuss any questions you have with your health care provider.   I think that you would greatly benefit from seeing a nutritionist.  If you are interested, please call Dr. Wonda at [phone] to schedule an appointment.

## 2024-05-13 NOTE — Progress Notes (Signed)
 Subjective:  Patient ID: Frank Haas, male    DOB: 06/27/1945, 78 y.o.   MRN: [id number]  Patient Care Team: [place]a [place] HERO, FNP as PCP - General (Family Medicine)   Chief Complaint:  Medical Management of Chronic Issues   HPI: Frank Haas is a 78 y.o. male presenting on 05/13/2024 for Medical Management of Chronic Issues    Frank Haas is a 78 year old male with hypertension who presents for medication management and evaluation of leg pain.  He has been taking propranolol  for his tremor, which has shown slight improvement. Despite daily use, his blood pressure remains elevated. No headaches, chest pain, leg swelling, or shortness of breath are present.  He experiences a persistent dry cough occurring daily. He recalls coughing during dinner yesterday and today.  He reports occasional knee pain, particularly in cold weather, attributed to a past chainsaw injury. He uses heat and Tylenol for relief, which he finds helpful. No recent falls, but increased leg pain is noted by both him and his family member.  He has been sleeping more than usual, often taking naps during the day, and snores loudly. He has not been tested for this condition before.  His family member mentions increased complaints about leg pain. There is a history of low B12 levels.          Relevant past medical, surgical, family, and social history reviewed and updated as indicated.  Allergies and medications reviewed and updated. Data reviewed: Chart in Epic.   Past Medical History:  Diagnosis Date   Blind right eye    Scoliosis     Past Surgical History:  Procedure Laterality Date   EYE SURGERY     right eye removed    Social History   Socioeconomic History   Marital status: Married    Spouse name: Not on file   Number of children: Not on file   Years of education: Not on file   Highest education level: Not on file  Occupational History   Not on file  Tobacco Use   Smoking status:  Former    Types: Cigarettes   Smokeless tobacco: Never  Vaping Use   Vaping status: Never Used  Substance and Sexual Activity   Alcohol use: No   Drug use: No   Sexual activity: Not on file  Other Topics Concern   Not on file  Social History Narrative   Not on file   Social Drivers of Health   Tobacco Use: Medium Risk (05/13/2024)   Patient History    Smoking Tobacco Use: Former    Smokeless Tobacco Use: Never    Passive Exposure: Not on file  Financial Resource Strain: Low Risk (01/12/2024)   Overall Financial Resource Strain (CARDIA)    Difficulty of Paying Living Expenses: Not hard at all  Food Insecurity: No Food Insecurity (01/12/2024)   Epic    Worried About Radiation Protection Practitioner of Food in the Last Year: Never true    Ran Out of Food in the Last Year: Never true  Transportation Needs: No Transportation Needs (01/12/2024)   Epic    Lack of Transportation (Medical): No    Lack of Transportation (Non-Medical): No  Physical Activity: Inactive (01/12/2024)   Exercise Vital Sign    Days of Exercise per Week: 0 days    Minutes of Exercise per Session: 0 min  Stress: No Stress Concern Present (01/12/2024)   [employer] of Occupational Health - Occupational Stress Questionnaire  Feeling of Stress: Not at all  Social Connections: Socially Isolated (01/12/2024)   Social Connection and Isolation Panel    Frequency of Communication with Friends and Family: Once a week    Frequency of Social Gatherings with Friends and Family: Once a week    Attends Religious Services: Never    Database Administrator or Organizations: No    Attends Banker Meetings: Never    Marital Status: Married  Catering Manager Violence: Not At Risk (01/12/2024)   Epic    Fear of Current or Ex-Partner: No    Emotionally Abused: No    Physically Abused: No    Sexually Abused: No  Depression (PHQ2-9): Low Risk (01/12/2024)   Depression (PHQ2-9)    PHQ-2 Score: 1  Alcohol Screen: Low Risk  (01/12/2024)   Alcohol Screen    Last Alcohol Screening Score (AUDIT): 0  Housing: Unknown (01/12/2024)   Epic    Unable to Pay for Housing in the Last Year: No    Number of Times Moved in the Last Year: Not on file    Homeless in the Last Year: No  Utilities: Not At Risk (01/12/2024)   Epic    Threatened with loss of utilities: No  Health Literacy: Adequate Health Literacy (01/12/2024)   B1300 Health Literacy    Frequency of need for help with medical instructions: Never    Outpatient Encounter Medications as of 05/13/2024  Medication Sig   acetaminophen (TYLENOL) 650 MG CR tablet Take 650 mg by mouth as needed for pain.   aspirin  EC 81 MG tablet Take 1 tablet (81 mg total) by mouth daily. Swallow whole.   atorvastatin  (LIPITOR) 10 MG tablet Take 1 tablet (10 mg total) by mouth at bedtime.   loratadine (CLARITIN) 10 MG tablet Take 10 mg by mouth daily.   losartan (COZAAR) 50 MG tablet Take 1 tablet (50 mg total) by mouth daily.   propranolol  (INDERAL ) 20 MG tablet TAKE 1 TABLET BY MOUTH TWICE A DAY   [DISCONTINUED] lisinopril  (ZESTRIL ) 5 MG tablet Take 1 tablet (5 mg total) by mouth daily.   No facility-administered encounter medications on file as of 05/13/2024.    Allergies  Allergen Reactions   Demerol [Meperidine] Nausea Only   Meperidine Hcl Other (See Comments)    Pertinent ROS per HPI, otherwise unremarkable      Objective:  BP (!) 159/75   Pulse 76   Temp 97.9 F (36.6 C)   Ht 6' 1 (1.854 m)   Wt 165 lb 6.4 oz (75 kg)   SpO2 97%   BMI 21.82 kg/m    Wt Readings from Last 3 Encounters:  05/13/24 165 lb 6.4 oz (75 kg)  01/12/24 164 lb (74.4 kg)  10/30/23 164 lb 3.2 oz (74.5 kg)    Physical Exam Vitals and nursing note reviewed.  Constitutional:      General: He is not in acute distress.    Appearance: Normal appearance. He is well-developed and well-groomed. He is not ill-appearing, toxic-appearing or diaphoretic.  HENT:     Head: Normocephalic and  atraumatic.     Jaw: There is normal jaw occlusion.     Right Ear: Decreased hearing noted.     Left Ear: Decreased hearing noted.     Nose: Nose normal.     Mouth/Throat:     Lips: Pink.     Mouth: Mucous membranes are moist.     Pharynx: Oropharynx is clear. Uvula midline.  Eyes:  General: Lids are normal.     Comments: Right eye absent  Neck:     Trachea: Trachea and phonation normal.  Cardiovascular:     Rate and Rhythm: Normal rate and regular rhythm.     Chest Wall: PMI is not displaced.     Pulses: Normal pulses.     Heart sounds: Normal heart sounds. No murmur heard.    No friction rub. No gallop.  Pulmonary:     Effort: Pulmonary effort is normal. No respiratory distress.     Breath sounds: Normal breath sounds. No wheezing.  Abdominal:     General: Bowel sounds are normal. There is no abdominal bruit.     Palpations: Abdomen is soft. There is no hepatomegaly or splenomegaly.  Musculoskeletal:     Cervical back: Neck supple.     Right lower leg: No edema.     Left lower leg: No edema.     Comments: Kyphosis   Skin:    General: Skin is warm and dry.     Capillary Refill: Capillary refill takes less than 2 seconds.     Coloration: Skin is not cyanotic, jaundiced or pale.     Findings: No rash.  Neurological:     General: No focal deficit present.     Mental Status: He is alert and oriented to person, place, and time.     Sensory: Sensation is intact.     Motor: Motor function is intact.     Coordination: Coordination is intact.     Gait: Gait abnormal (slow, using cane).     Deep Tendon Reflexes: Reflexes are normal and symmetric.  Psychiatric:        Attention and Perception: Attention and perception normal.        Mood and Affect: Mood and affect normal.        Speech: Speech normal.        Behavior: Behavior normal. Behavior is cooperative.        Thought Content: Thought content normal.        Cognition and Memory: Cognition and memory normal.         Judgment: Judgment normal.       Results for orders placed or performed in visit on 10/30/23  BMP8+EGFR   Collection Time: 10/30/23  9:00 AM  Result Value Ref Range   Glucose 100 (H) 70 - 99 mg/dL   BUN 11 8 - 27 mg/dL   Creatinine, Ser 8.86 0.76 - 1.27 mg/dL   eGFR 67 >40 fO/fpw/8.26   BUN/Creatinine Ratio 10 10 - 24   Sodium 139 134 - 144 mmol/L   Potassium 4.8 3.5 - 5.2 mmol/L   Chloride 99 96 - 106 mmol/L   CO2 21 20 - 29 mmol/L   Calcium  8.9 8.6 - 10.2 mg/dL       Pertinent labs & imaging results that were available during my care of the patient were reviewed by me and considered in my medical decision making.  Assessment & Plan:  Frank Haas was seen today for medical management of chronic issues.  Diagnoses and all orders for this visit:  Mixed hyperlipidemia -     CMP14+EGFR -     Lipid panel -     Anemia Profile B  Primary hypertension -     CMP14+EGFR -     Lipid panel -     losartan (COZAAR) 50 MG tablet; Take 1 tablet (50 mg total) by mouth daily. -  Anemia Profile B  Vitamin D  deficiency -     CMP14+EGFR -     VITAMIN D  25 Hydroxy (Vit-D Deficiency, Fractures)  Mixed conductive and sensorineural hearing loss of both ears  Essential tremor -     CMP14+EGFR -     Anemia Profile B  Vitamin B 12 deficiency -     Anemia Profile B  Multiple joint pain -     CMP14+EGFR -     VITAMIN D  25 Hydroxy (Vit-D Deficiency, Fractures) -     Anemia Profile B  Daytime sleepiness -     Ambulatory referral to Sleep Studies  Loud snoring -     Ambulatory referral to Sleep Studies     Primary hypertension Blood pressure remains elevated despite current medication regimen. Reports a dry cough, likely due to lisinopril . - Increased propranolol  dose to 10 mg daily - Discontinued lisinopril  - Initiated losartan for blood pressure management  Essential tremor Tremor appears to be slightly improved with current propranolol  regimen. - Continue propranolol  at  increased dose of 10 mg daily  Mixed conductive and sensorineural hearing loss, bilateral Hearing remains unchanged.  Multiple joint pain Intermittent knee pain, possibly exacerbated by cold weather. Tylenol provides relief. - Continue Tylenol as needed for joint pain  Vitamin B12 deficiency Previous low B12 levels may contribute to leg pain. - Ordered blood test to check B12 levels  Vitamin D  deficiency Vitamin D  levels to be assessed as part of overall health evaluation. - Ordered blood test to check vitamin D  levels  Possible sleep apnea Reports increased daytime sleepiness and loud snoring, suggestive of sleep apnea. - Referred to Dr. Oma, sleep specialist, for sleep apnea evaluation          Continue all other maintenance medications.  Follow up plan: Return in about 8 weeks (around 07/08/2024) for HTN.   Continue healthy lifestyle choices, including diet (rich in fruits, vegetables, and lean proteins, and low in salt and simple carbohydrates) and exercise (at least 30 minutes of moderate physical activity daily).  Educational handout given for DASH diet, HTN  The above assessment and management plan was discussed with the patient. The patient verbalized understanding of and has agreed to the management plan. Patient is aware to call the clinic if they develop any new symptoms or if symptoms persist or worsen. Patient is aware when to return to the clinic for a follow-up visit. Patient educated on when it is appropriate to go to the emergency department.   Rosaline Bruns, FNP-C [facility] [phone]

## 2024-05-14 LAB — ANEMIA PROFILE B
Basophils Absolute: 1 x10E3/uL — ABNORMAL HIGH (ref 0.0–0.2)
Basos: 5 %
EOS (ABSOLUTE): 3.8 x10E3/uL — ABNORMAL HIGH (ref 0.0–0.4)
Eos: 19 %
Ferritin: 392 ng/mL (ref 30–400)
Folate: 8.4 ng/mL (ref >3.0)
Hematocrit: 47.2 % (ref 37.5–51.0)
Hemoglobin: 13.6 g/dL (ref 13.0–17.7)
Immature Grans (Abs): 0.2 x10E3/uL — ABNORMAL HIGH (ref 0.0–0.1)
Immature Granulocytes: 1 %
Iron Saturation: 54 % (ref 15–55)
Iron: 159 ug/dL (ref 38–169)
Lymphocytes Absolute: 2.2 x10E3/uL (ref 0.7–3.1)
Lymphs: 11 %
MCH: 24.9 pg — ABNORMAL LOW (ref 26.6–33.0)
MCHC: 28.8 g/dL — ABNORMAL LOW (ref 31.5–35.7)
MCV: 86 fL (ref 79–97)
Monocytes Absolute: 0.4 x10E3/uL (ref 0.1–0.9)
Monocytes: 2 %
NRBC: 1 % — ABNORMAL HIGH (ref 0–0)
Neutrophils Absolute: 12.5 x10E3/uL — ABNORMAL HIGH (ref 1.4–7.0)
Neutrophils: 62 %
Platelets: 607 x10E3/uL — ABNORMAL HIGH (ref 150–450)
RBC: 5.47 x10E6/uL (ref 4.14–5.80)
RDW: 32.3 % — ABNORMAL HIGH (ref 11.6–15.4)
Retic Ct Pct: 1.7 % (ref 0.6–2.6)
Total Iron Binding Capacity: 292 ug/dL (ref 250–450)
UIBC: 133 ug/dL (ref 111–343)
Vitamin B-12: >2000 pg/mL — ABNORMAL HIGH (ref 232–1245)
WBC: 20 x10E3/uL (ref 3.4–10.8)

## 2024-05-14 LAB — CMP14+EGFR
ALT: 10 IU/L (ref 0–44)
AST: 19 IU/L (ref 0–40)
Albumin: 4.5 g/dL (ref 3.8–4.8)
Alkaline Phosphatase: 67 IU/L (ref 47–123)
BUN/Creatinine Ratio: 9 — ABNORMAL LOW (ref 10–24)
BUN: 10 mg/dL (ref 8–27)
Bilirubin Total: 1.9 mg/dL — ABNORMAL HIGH (ref 0.0–1.2)
CO2: 26 mmol/L (ref 20–29)
Calcium: 9.4 mg/dL (ref 8.6–10.2)
Chloride: 98 mmol/L (ref 96–106)
Creatinine, Ser: 1.09 mg/dL (ref 0.76–1.27)
Globulin, Total: 2.1 g/dL (ref 1.5–4.5)
Glucose: 90 mg/dL (ref 70–99)
Potassium: 5.5 mmol/L — ABNORMAL HIGH (ref 3.5–5.2)
Sodium: 136 mmol/L (ref 134–144)
Total Protein: 6.6 g/dL (ref 6.0–8.5)
eGFR: 70 mL/min/1.73 (ref >59)

## 2024-05-14 LAB — VITAMIN D 25 HYDROXY (VIT D DEFICIENCY, FRACTURES): Vit D, 25-Hydroxy: 16.9 ng/mL — ABNORMAL LOW (ref 30.0–100.0)

## 2024-05-14 LAB — LIPID PANEL
Chol/HDL Ratio: 2.6 ratio (ref 0.0–5.0)
Cholesterol, Total: 116 mg/dL (ref 100–199)
HDL: 45 mg/dL (ref >39)
LDL Chol Calc (NIH): 55 mg/dL (ref 0–99)
Triglycerides: 80 mg/dL (ref 0–149)
VLDL Cholesterol Cal: 16 mg/dL (ref 5–40)

## 2024-05-15 ENCOUNTER — Ambulatory Visit: Payer: Self-pay | Admitting: Family Medicine

## 2024-05-15 DIAGNOSIS — E875 Hyperkalemia: Secondary | ICD-10-CM

## 2024-05-17 ENCOUNTER — Other Ambulatory Visit

## 2024-05-17 DIAGNOSIS — E875 Hyperkalemia: Secondary | ICD-10-CM

## 2024-05-17 LAB — BASIC METABOLIC PANEL WITH GFR
BUN/Creatinine Ratio: 8 — ABNORMAL LOW (ref 10–24)
BUN: 9 mg/dL (ref 8–27)
CO2: 25 mmol/L (ref 20–29)
Calcium: 9 mg/dL (ref 8.6–10.2)
Chloride: 99 mmol/L (ref 96–106)
Creatinine, Ser: 1.12 mg/dL (ref 0.76–1.27)
Glucose: 105 mg/dL — ABNORMAL HIGH (ref 70–99)
Potassium: 4.8 mmol/L (ref 3.5–5.2)
Sodium: 138 mmol/L (ref 134–144)
eGFR: 68 mL/min/1.73 (ref >59)

## 2024-05-18 ENCOUNTER — Ambulatory Visit: Payer: Self-pay | Admitting: Family Medicine

## 2024-06-24 ENCOUNTER — Telehealth: Payer: Self-pay | Admitting: Family Medicine

## 2024-06-24 DIAGNOSIS — I10 Essential (primary) hypertension: Secondary | ICD-10-CM

## 2024-06-24 DIAGNOSIS — G250 Essential tremor: Secondary | ICD-10-CM

## 2024-06-24 MED ORDER — PROPRANOLOL HCL 20 MG PO TABS: 20.0000 mg | ORAL_TABLET | Freq: Two times a day (BID) | ORAL | 1 refills | Status: AC

## 2024-06-24 NOTE — Telephone Encounter (Signed)
 Refill sent

## 2024-06-24 NOTE — Telephone Encounter (Signed)
 Copied from CRM #[id number]. Topic: Clinical - Medication Refill >> Jun 24, 2024 10:05 AM Susanna ORN wrote: Medication: propranolol  (INDERAL ) 20 MG tablet  Has the patient contacted their pharmacy? Yes, pharmacy told patient that he can;t get it refilled until the end of the month.  (Agent: If no, request that the patient contact the pharmacy for the refill. If patient does not wish to contact the pharmacy document the reason why and proceed with request.) (Agent: If yes, when and what did the pharmacy advise?)  This is the patient's preferred pharmacy:  [id number] - [place], [place] - [address] [place] [address] Phone: [phone] Fax: [phone]  Is this the correct pharmacy for this prescription? Yes If no, delete pharmacy and type the correct one.   Has the prescription been filled recently? Yes  Is the patient out of the medication? No but will be out tomorrow.   Has the patient been seen for an appointment in the last year OR does the patient have an upcoming appointment? Yes  Can we respond through MyChart? No  Agent: Please be advised that Rx refills may take up to 3 business days. We ask that you follow-up with your pharmacy.

## 2024-06-29 ENCOUNTER — Ambulatory Visit: Admitting: Family Medicine

## 2025-01-12 ENCOUNTER — Ambulatory Visit: Payer: Self-pay
